# Patient Record
Sex: Male | Born: 2000 | Hispanic: No | Marital: Single | Smoking: Current every day smoker
Health system: Southern US, Community
[De-identification: ages and names within clinical notes are randomized; demographics above are authoritative.]

## PROBLEM LIST (undated history)

## (undated) DIAGNOSIS — Z789 Other specified health status: Secondary | ICD-10-CM

## (undated) DIAGNOSIS — F319 Bipolar disorder, unspecified: Secondary | ICD-10-CM

## (undated) DIAGNOSIS — T754XXA Electrocution, initial encounter: Secondary | ICD-10-CM

## (undated) DIAGNOSIS — J302 Other seasonal allergic rhinitis: Secondary | ICD-10-CM

## (undated) DIAGNOSIS — R4689 Other symptoms and signs involving appearance and behavior: Secondary | ICD-10-CM

## (undated) DIAGNOSIS — F32A Depression, unspecified: Secondary | ICD-10-CM

## (undated) DIAGNOSIS — F909 Attention-deficit hyperactivity disorder, unspecified type: Secondary | ICD-10-CM

## (undated) DIAGNOSIS — F819 Developmental disorder of scholastic skills, unspecified: Secondary | ICD-10-CM

## (undated) DIAGNOSIS — F329 Major depressive disorder, single episode, unspecified: Secondary | ICD-10-CM

---
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
## 2019-01-23 ENCOUNTER — Encounter (HOSPITAL_COMMUNITY): Payer: Self-pay | Admitting: Emergency Medicine

## 2019-01-23 ENCOUNTER — Emergency Department (HOSPITAL_COMMUNITY)
Admission: EM | Admit: 2019-01-23 | Discharge: 2019-01-23 | Disposition: A | Discharge status: Home / Self Care | Payer: Medicaid Other | Attending: Emergency Medicine | Admitting: Emergency Medicine

## 2019-01-23 ENCOUNTER — Other Ambulatory Visit: Payer: Self-pay

## 2019-01-23 DIAGNOSIS — F251 Schizoaffective disorder, depressive type: Secondary | ICD-10-CM | POA: Insufficient documentation

## 2019-01-23 DIAGNOSIS — Z79899 Other long term (current) drug therapy: Secondary | ICD-10-CM | POA: Insufficient documentation

## 2019-01-23 DIAGNOSIS — Z87891 Personal history of nicotine dependence: Secondary | ICD-10-CM | POA: Insufficient documentation

## 2019-01-23 DIAGNOSIS — J45909 Unspecified asthma, uncomplicated: Secondary | ICD-10-CM | POA: Insufficient documentation

## 2019-01-23 DIAGNOSIS — R45851 Suicidal ideations: Secondary | ICD-10-CM | POA: Diagnosis not present

## 2019-01-23 LAB — COMPREHENSIVE METABOLIC PANEL
ALT: 24 U/L (ref 0–44)
AST: 27 U/L (ref 15–41)
Albumin: 4.3 g/dL (ref 3.5–5.0)
Alkaline Phosphatase: 41 U/L (ref 38–126)
Anion gap: 12 (ref 5–15)
BUN: 15 mg/dL (ref 6–20)
CO2: 20 mmol/L — ABNORMAL LOW (ref 22–32)
Calcium: 9.4 mg/dL (ref 8.9–10.3)
Chloride: 108 mmol/L (ref 98–111)
Creatinine, Ser: 0.84 mg/dL (ref 0.61–1.24)
GFR calc Af Amer: >60 mL/min (ref >60)
GFR calc non Af Amer: >60 mL/min (ref >60)
Glucose, Bld: 98 mg/dL (ref 70–99)
Potassium: 3.8 mmol/L (ref 3.5–5.1)
Sodium: 140 mmol/L (ref 135–145)
Total Bilirubin: 1.2 mg/dL (ref 0.3–1.2)
Total Protein: 6.7 g/dL (ref 6.5–8.1)

## 2019-01-23 LAB — CBC
HCT: 46.7 % (ref 39.0–52.0)
Hemoglobin: 15.5 g/dL (ref 13.0–17.0)
MCH: 31.2 pg (ref 26.0–34.0)
MCHC: 33.2 g/dL (ref 30.0–36.0)
MCV: 94 fL (ref 80.0–100.0)
Platelets: 219 10*3/uL (ref 150–400)
RBC: 4.97 MIL/uL (ref 4.22–5.81)
RDW: 12.9 % (ref 11.5–15.5)
WBC: 8.9 10*3/uL (ref 4.0–10.5)
nRBC: 0 % (ref 0.0–0.2)

## 2019-01-23 LAB — SALICYLATE LEVEL: Salicylate Lvl: <7 mg/dL — ABNORMAL LOW (ref 7.0–30.0)

## 2019-01-23 LAB — ETHANOL: Alcohol, Ethyl (B): <10 mg/dL (ref <10)

## 2019-01-23 LAB — ACETAMINOPHEN LEVEL: Acetaminophen (Tylenol), Serum: <10 ug/mL — ABNORMAL LOW (ref 10–30)

## 2019-01-23 NOTE — ED Notes (Signed)
Patient Alert and oriented to baseline. Stable and ambulatory to baseline. Patient verbalized understanding of the discharge instructions.  Patient belongings were taken by the patient.   

## 2019-01-23 NOTE — ED Notes (Signed)
Belongings inventoried and placed into locker #5
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
## 2019-01-23 NOTE — Discharge Instructions (Signed)
Return to the emergency department if any concerning signs or symptoms develop. 

## 2019-01-23 NOTE — BHH Counselor (Signed)
  BH ASSESSMENT DISPOSITION  [facility] discussed case with BH Provider, Denzil Magnuson, NP who psych cleared pt and recommends discharge.  NP encouraged pt to continue to follow up with established community [facility] outpatient services at [facility].    Beverely Suen L. Nicky Kras, MS, [facility], [facility] Therapeutic Triage Specialist  [phone]

## 2019-01-23 NOTE — ED Notes (Signed)
Pt in shower.  

## 2019-01-23 NOTE — ED Provider Notes (Signed)
[facility] EMERGENCY DEPARTMENT Provider Note   CSN: [id number] Arrival date & time: 01/23/19  2458     History Chief Complaint  Patient presents with  . Suicidal    [name] [name] is a 19 y.o. male.   19 year old male with history of ADHD, aggressive behavior, bipolar 1 disorder presents to the emergency department complaining of suicidal ideations.  He has been seen in the ED multiple times for similar complaints.  Was last assessed by TTS 1 month ago.  Has been noncompliant with his medications for many months.  Reports thoughts to kill self by slitting wrists or hanging.  Endorses an episode of self injury a few months ago where he tried to cut his neck; however, this never required medical attention or stitches.  States that he lives with his cousin.  Has been using marijuana, but denies other illicit drug use or alcohol use.  Denies homicidal ideations.  The history is provided by the patient. No language interpreter was used.       Past Medical History:  Diagnosis Date  . ADHD (attention deficit hyperactivity disorder)   . Aggressive behavior 08/24/2018  . Asthma   . Bipolar 1 disorder ([place])   . Depression   . Learning disability   . Medical history non-contributory   . Seasonal allergies   . Shock from electroshock gun (taser) 08/24/2018   Patient was Tased by [employer] at [facility] street due to aggresive and hostile behavior.    Patient Active Problem List   Diagnosis Date Noted  . Disruptive behavior   . Schizoaffective disorder, bipolar type ([place]) 08/23/2018  . Schizoaffective disorder ([place]) 08/20/2018  . Bipolar I disorder, current or most recent episode manic, severe ([place]) 01/05/2018  . Aggressive behavior of adolescent   . DMDD (disruptive mood dysregulation disorder) ([place]) 01/12/2014  . Attention deficit hyperactivity disorder (ADHD), combined type, severe 01/12/2014  . Oppositional defiant disorder 01/12/2014  . Speech sound disorder  01/12/2014  . 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 Cannabis use disorder, mild, abuse 01/12/2014    History reviewed. No pertinent surgical history.     No family history on file.  Social History   Tobacco Use  . Smoking status: Former Smoker    Packs/day: 1.00    Types: Cigarettes  . Smokeless tobacco: Never Used  Substance Use Topics  . Alcohol use: No  . Drug use: Yes    Frequency: 3.0 times per week    Types: Marijuana    Comment: 2-3 blunts at a time    Home Medications Prior to Admission medications   Medication Sig Start Date End Date Taking? Authorizing Provider  ARIPiprazole ER (ABILIFY MAINTENA) 400 MG SRER injection Inject 2 mLs (400 mg total) into the muscle every 28 (twenty-eight) days. Due 9/30 Patient taking differently: Inject 400 mg into the muscle every 28 (twenty-eight) days.  10/14/18   Johnn Hai, MD  benztropine (COGENTIN) 0.5 MG tablet Take 1 tablet (0.5 mg total) by mouth 2 (two) times daily. 09/20/18   Johnn Hai, MD  FLUoxetine (PROZAC) 20 MG capsule Take 20 mg by mouth daily.    [provider]  gabapentin (NEURONTIN) 300 MG capsule Take 1 capsule (300 mg total) by mouth 3 (three) times daily. Patient not taking: Reported on 11/15/2018 09/20/18   Johnn Hai, MD  hydrOXYzine (ATARAX/VISTARIL) 50 MG tablet Take 50 mg by mouth 2 (two) times daily as needed for anxiety.  [provider]  lithium carbonate (ESKALITH) 450 MG CR tablet Take 1 tablet (450 mg total) by mouth 2 (two) times daily. 09/20/18   Malvin Johns, MD  risperiDONE (RISPERDAL) 2 MG tablet Take 2 mg by mouth at bedtime.    [provider]    Allergies    Haloperidol and related and Trazodone and nefazodone  Review of Systems   Review of Systems  Ten systems reviewed and are negative for acute change, except as noted in the HPI.    Physical Exam Updated Vital Signs BP 106/87 (BP Location: Right Arm)   Pulse  66   Temp 98.4 F (36.9 C) (Oral)   Resp 16   SpO2 99%   Physical Exam Vitals and nursing note reviewed.  Constitutional:      General: He is not in acute distress.    Appearance: He is well-developed. He is not diaphoretic.  HENT:     Head: Normocephalic and atraumatic.  Eyes:     General: No scleral icterus.    Conjunctiva/sclera: Conjunctivae normal.  Pulmonary:     Effort: Pulmonary effort is normal. No respiratory distress.     Comments: Respirations even and unlabored Musculoskeletal:        General: Normal range of motion.     Cervical back: Normal range of motion.  Skin:    General: Skin is warm and dry.     Coloration: Skin is not pale.     Findings: No erythema or rash.  Neurological:     Mental Status: He is alert and oriented to person, place, and time.  Psychiatric:        Behavior: Behavior normal.     Comments: SI with thoughts to cut wrists or hang self     ED Results / Procedures / Treatments   Labs (all labs ordered are listed, but only abnormal results are displayed) Labs Reviewed  CBC  COMPREHENSIVE METABOLIC PANEL  ETHANOL  SALICYLATE LEVEL  ACETAMINOPHEN LEVEL  RAPID URINE DRUG SCREEN, HOSP PERFORMED    EKG None  Radiology No results found.  Procedures Procedures (including critical care time)  Medications Ordered in ED Medications - No data to display  ED Course  I have reviewed the triage vital signs and the nursing notes.  Pertinent labs & imaging results that were available during my care of the patient were reviewed by me and considered in my medical decision making (see chart for details).   4:19 AM Covid swab held at this time as patient has historically been discharged from the ED following TTS evaluation.     MDM Rules/Calculators/A&P                       19 year old male presenting to the emergency department for complaints of suicidal thoughts.  He has a history of chronic SI for which she has been seen  multiple times in the ED.  Last assessed 1 month ago.  Patient noncompliant with his psychiatric medications.  Presently cooperative, pending TTS assessment.  Disposition to be determined by oncoming ED provider.   Final Clinical Impression(s) / ED Diagnoses Final diagnoses:  Suicidal thoughts    Rx / DC Orders ED Discharge Orders    None       Antony Madura, PA-C 01/23/19 0545    Geoffery Lyons, MD 01/23/19 [phone]

## 2019-01-23 NOTE — ED Triage Notes (Signed)
Patient reports suicidal ideation - plans to cut his wrist with auditory hallucinations onset this week .

## 2019-05-14 ENCOUNTER — Encounter (HOSPITAL_COMMUNITY): Payer: Self-pay | Admitting: *Deleted

## 2019-05-14 ENCOUNTER — Emergency Department (HOSPITAL_COMMUNITY)
Admission: EM | Admit: 2019-05-14 | Discharge: 2019-05-15 | Disposition: A | Discharge status: Other Acute Inpatient Hospital | Payer: Medicaid Other | Attending: Emergency Medicine | Admitting: Emergency Medicine

## 2019-05-14 DIAGNOSIS — F4321 Adjustment disorder with depressed mood: Secondary | ICD-10-CM | POA: Insufficient documentation

## 2019-05-14 DIAGNOSIS — X838XXA Intentional self-harm by other specified means, initial encounter: Secondary | ICD-10-CM | POA: Diagnosis not present

## 2019-05-14 DIAGNOSIS — F1290 Cannabis use, unspecified, uncomplicated: Secondary | ICD-10-CM | POA: Insufficient documentation

## 2019-05-14 DIAGNOSIS — Z20822 Contact with and (suspected) exposure to covid-19: Secondary | ICD-10-CM | POA: Insufficient documentation

## 2019-05-14 DIAGNOSIS — Z046 Encounter for general psychiatric examination, requested by authority: Secondary | ICD-10-CM | POA: Diagnosis present

## 2019-05-14 DIAGNOSIS — F319 Bipolar disorder, unspecified: Secondary | ICD-10-CM | POA: Insufficient documentation

## 2019-05-14 DIAGNOSIS — F322 Major depressive disorder, single episode, severe without psychotic features: Secondary | ICD-10-CM | POA: Diagnosis not present

## 2019-05-14 DIAGNOSIS — Z87891 Personal history of nicotine dependence: Secondary | ICD-10-CM | POA: Diagnosis not present

## 2019-05-14 LAB — COMPREHENSIVE METABOLIC PANEL
ALT: 30 U/L (ref 0–44)
AST: 28 U/L (ref 15–41)
Albumin: 4.6 g/dL (ref 3.5–5.0)
Alkaline Phosphatase: 46 U/L (ref 38–126)
Anion gap: 12 (ref 5–15)
BUN: 8 mg/dL (ref 6–20)
CO2: 26 mmol/L (ref 22–32)
Calcium: 10.1 mg/dL (ref 8.9–10.3)
Chloride: 105 mmol/L (ref 98–111)
Creatinine, Ser: 1.03 mg/dL (ref 0.61–1.24)
GFR calc Af Amer: >60 mL/min (ref >60)
GFR calc non Af Amer: >60 mL/min (ref >60)
Glucose, Bld: 99 mg/dL (ref 70–99)
Potassium: 3.3 mmol/L — ABNORMAL LOW (ref 3.5–5.1)
Sodium: 143 mmol/L (ref 135–145)
Total Bilirubin: 1.1 mg/dL (ref 0.3–1.2)
Total Protein: 8 g/dL (ref 6.5–8.1)

## 2019-05-14 LAB — CBC
HCT: 50.8 % (ref 39.0–52.0)
Hemoglobin: 16.9 g/dL (ref 13.0–17.0)
MCH: 31.5 pg (ref 26.0–34.0)
MCHC: 33.3 g/dL (ref 30.0–36.0)
MCV: 94.8 fL (ref 80.0–100.0)
Platelets: 283 10*3/uL (ref 150–400)
RBC: 5.36 MIL/uL (ref 4.22–5.81)
RDW: 12.8 % (ref 11.5–15.5)
WBC: 7.2 10*3/uL (ref 4.0–10.5)
nRBC: 0 % (ref 0.0–0.2)

## 2019-05-14 LAB — ETHANOL: Alcohol, Ethyl (B): <10 mg/dL (ref <10)

## 2019-05-14 LAB — SALICYLATE LEVEL: Salicylate Lvl: <7 mg/dL — ABNORMAL LOW (ref 7.0–30.0)

## 2019-05-14 LAB — ACETAMINOPHEN LEVEL: Acetaminophen (Tylenol), Serum: <10 ug/mL — ABNORMAL LOW (ref 10–30)

## 2019-05-14 MED ORDER — STERILE WATER FOR INJECTION IJ SOLN
INTRAMUSCULAR | Status: AC
  Filled 2019-05-14: qty 10

## 2019-05-14 MED ORDER — ZIPRASIDONE MESYLATE 20 MG IM SOLR
INTRAMUSCULAR | Status: AC
  Filled 2019-05-14: qty 20

## 2019-05-14 MED ORDER — ZOLPIDEM TARTRATE 5 MG PO TABS
5.0000 mg | ORAL_TABLET | Freq: Every evening | ORAL | Status: DC | PRN
  Administered 2019-05-14: 5 mg via ORAL
  Filled 2019-05-14: qty 1

## 2019-05-14 MED ORDER — IBUPROFEN 400 MG PO TABS
600.0000 mg | ORAL_TABLET | Freq: Three times a day (TID) | ORAL | Status: DC | PRN
  Administered 2019-05-15: 09:00:00 600 mg via ORAL
  Filled 2019-05-14: qty 1

## 2019-05-14 MED ORDER — ZIPRASIDONE MESYLATE 20 MG IM SOLR
20.0000 mg | Freq: Once | INTRAMUSCULAR | Status: AC
  Administered 2019-05-14: 20 mg via INTRAMUSCULAR

## 2019-05-14 NOTE — ED Notes (Signed)
Pt's uncle in visiting with pt at this time.  Pt calm

## 2019-05-14 NOTE — ED Notes (Addendum)
1200: Pt is agitated- pacing room and punched wall. EDP made aware. RN went into med room and sitter was in another pt's room. WHen RN came out pt was gone. Other sitter followed him and pt was escorted back to room by security and [employer]. He became very tense and was clinching fist and hitting wall creating damage. He took battery cover off of remote and attempted to cut his arm with it. We told him we were not going to let him hurt himself. He states he is "going to find a way to hurt himself no matter what, even if he has to take the officer's gun."  Security able to calm him with talking and RN called EDP to request meds. RN pulled up meds and security was able to convince him to allow RN to given it in his R deltoid. [employer] officer, Sadie, then was able to talk to him after and provide supportive listening. He discussed feeling sad, angry, and anxious. Hates that meds cause trembles. He had seen therapist in past but not working.  He then sat on bed and felt "woozy". He lied down and was covered with blanket. He was introduced to security guard who will be here this afternoon so he will have someone to talk to and not act out.

## 2019-05-14 NOTE — ED Triage Notes (Signed)
Pt arrived in gpd custody. Reported recent family loss and pt was found trying to jump off a ledge. Pt handcuffed and here for emergency commitment per officer.

## 2019-05-14 NOTE — ED Notes (Addendum)
ED Provider at bedside. PT has been wanded and staffing called for sitter (unavailable until 1500).

## 2019-05-14 NOTE — ED Provider Notes (Signed)
[facility] EMERGENCY DEPARTMENT Provider Note   CSN: [id number] Arrival date & time: 05/14/19  3818     History Chief Complaint  Patient presents with  . Suicidal    [name] [name] is a 19 y.o. male.  HPI Patient has been at the hospital for the past 24 hours visiting with his mother who was dying.  She died this morning.  He became very upset and went out of the hospital and climbed on a ledge of one of the walkway over passes.  He was spotted by a security guard and able to be brought down without injury.  Patient reports that he was just so tired he did not really know what he was doing.  He wants to leave now to go back and see if he can still visit with his mother and his other family members.  He insists that he will be okay and will not try to hurt himself again.  He reports he had history of self injury and cutting behavior but that was several years ago.  He denies he is doing any cutting anymore.  He denies any medical problems.    Past Medical History:  Diagnosis Date  . ADHD (attention deficit hyperactivity disorder)   . Aggressive behavior 08/24/2018  . Asthma   . Bipolar 1 disorder ([facility])   . Depression   . Learning disability   . Medical history non-contributory   . Seasonal allergies   . Shock from electroshock gun (taser) 08/24/2018   Patient was Tased by [employer] at [facility] across street due to aggresive and hostile behavior.    Patient Active Problem List   Diagnosis Date Noted  . Disruptive behavior   . Schizoaffective disorder, bipolar type ([facility]) 08/23/2018  . Schizoaffective disorder ([facility]) 08/20/2018  . Bipolar I disorder, current or most recent episode manic, severe ([facility]) 01/05/2018  . Aggressive behavior of adolescent   . DMDD (disruptive mood dysregulation disorder) ([facility]) 01/12/2014  . Attention deficit hyperactivity disorder (ADHD), combined type, severe 01/12/2014  . Oppositional defiant disorder 01/12/2014  . Speech sound disorder  01/12/2014  . 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 Cannabis use disorder, mild, abuse 01/12/2014    History reviewed. No pertinent surgical history.     History reviewed. No pertinent family history.  Social History   Tobacco Use  . Smoking status: Former Smoker    Packs/day: 1.00    Types: Cigarettes  . Smokeless tobacco: Never Used  Substance Use Topics  . Alcohol use: No  . Drug use: Yes    Frequency: 3.0 times per week    Types: Marijuana    Comment: 2-3 blunts at a time    Home Medications Prior to Admission medications   Medication Sig Start Date End Date Taking? Authorizing Provider  ARIPiprazole ER (ABILIFY MAINTENA) 400 MG SRER injection Inject 2 mLs (400 mg total) into the muscle every 28 (twenty-eight) days. Due 9/30 Patient not taking: Reported on 01/23/2019 10/14/18   Malvin Johns, MD  benztropine (COGENTIN) 0.5 MG tablet Take 1 tablet (0.5 mg total) by mouth 2 (two) times daily. Patient not taking: Reported on 01/23/2019 09/20/18   Malvin Johns, MD  gabapentin (NEURONTIN) 300 MG capsule Take 1 capsule (300 mg total) by mouth 3 (three) times daily. Patient not taking: Reported on 01/23/2019 09/20/18   Malvin Johns, MD  lithium carbonate (ESKALITH) 450 MG CR tablet Take 1 tablet (450 mg total) by mouth 2 (two) times daily.  Patient not taking: Reported on 01/23/2019 09/20/18   Malvin Johns, MD    Allergies    Haloperidol and related and Trazodone and nefazodone  Review of Systems   Review of Systems 10 Systems reviewed and are negative for acute change except as noted in the HPI. Physical Exam Updated Vital Signs BP 130/78   Pulse 84   Temp 98.1 F (36.7 C) (Oral)   Resp 18   SpO2 100%   Physical Exam Constitutional:      Comments: Patient is alert and nontoxic.  No respiratory distress.  As I go to his room, he has a sheet over himself and is lying on the floor.  He does sit up and talk to me.  HENT:     Head:  Normocephalic and atraumatic.     Mouth/Throat:     Mouth: Mucous membranes are moist.     Pharynx: Oropharynx is clear.  Eyes:     Extraocular Movements: Extraocular movements intact.  Cardiovascular:     Rate and Rhythm: Normal rate and regular rhythm.  Pulmonary:     Effort: Pulmonary effort is normal.     Breath sounds: Normal breath sounds.  Abdominal:     General: There is no distension.     Palpations: Abdomen is soft.     Tenderness: There is no abdominal tenderness. There is no guarding.  Musculoskeletal:     Comments: Old well-healed scars from cutting on the forearms.  No acute injuries.  No peripheral edema.  Skin:    General: Skin is warm and dry.  Neurological:     General: No focal deficit present.     Mental Status: He is oriented to person, place, and time.     Coordination: Coordination normal.  Psychiatric:     Comments: Patient's affect is flat and slightly withdrawn.     ED Results / Procedures / Treatments   Labs (all labs ordered are listed, but only abnormal results are displayed) Labs Reviewed  COMPREHENSIVE METABOLIC PANEL - Abnormal; Notable for the following components:      Result Value   Potassium 3.3 (*)    All other components within normal limits  SALICYLATE LEVEL - Abnormal; Notable for the following components:   Salicylate Lvl <7.0 (*)    All other components within normal limits  ACETAMINOPHEN LEVEL - Abnormal; Notable for the following components:   Acetaminophen (Tylenol), Serum <10 (*)    All other components within normal limits  ETHANOL  CBC  RAPID URINE DRUG SCREEN, HOSP PERFORMED    EKG None  Radiology No results found.  Procedures Procedures (including critical care time)  Medications Ordered in ED Medications - No data to display  ED Course  I have reviewed the triage vital signs and the nursing notes.  Pertinent labs & imaging results that were available during my care of the patient were reviewed by me and  considered in my medical decision making (see chart for details).    MDM Rules/Calculators/A&P                      Patient has had a recent significant stressor with the death of his mother last night.  He climbed on an overpass and was on the edge at risk for jumping.  Fortunately he was found by security and able to be brought to the ED.  No injuries sustained.  Patient is alert and appropriate.  No signs of acute illness.  Patient does  appear to be high risk for self injury.  IVC paperwork completed.  Patient is medical cleared for psych evaluation. Final Clinical Impression(s) / ED Diagnoses Final diagnoses:  Suicide attempt by inadequate means, initial encounter [facility])  Current severe episode of major depressive disorder without psychotic features without prior episode ([place])  Grief reaction    Rx / DC Orders ED Discharge Orders    None       Charlesetta Shanks, MD 05/14/19 1028

## 2019-05-14 NOTE — ED Notes (Signed)
Lunch Tray Ordered @ 1038.  
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
## 2019-05-14 NOTE — Progress Notes (Signed)
Pt meets inpatient criteria. Referral information has been sent to the following hospitals for review:  [facility] Details [facility] Details [facility] Details [facility] Details  [facility] Details [facility] Details [facility]-  Disposition will continue to assist with inpatient placement needs.   Wells Guiles, LCSW, LCAS Disposition CSW [facility]/TTS [phone]

## 2019-05-14 NOTE — ED Notes (Signed)
RN attempted to collect nasal swab and explained that it needed to be collected. Pt turned head to other side quickly but did not open eyes. RN attempted x 2 but was unsuccessful

## 2019-05-14 NOTE — Discharge Planning (Signed)
RNCM consulted regarding contacting family for pt after the passing of his mom.  RNCM reached Annett Gula, who states family is no longer in the hospital but she will reach out to him soon.  Mother given [facility] phone number.

## 2019-05-15 ENCOUNTER — Inpatient Hospital Stay (HOSPITAL_COMMUNITY)
Admission: AD | Admit: 2019-05-15 | Discharge: 2019-05-16 | DRG: 885 | Disposition: A | Discharge status: Home / Self Care | Payer: Medicaid Other | Attending: Psychiatry | Admitting: Psychiatry

## 2019-05-15 ENCOUNTER — Encounter (HOSPITAL_COMMUNITY): Payer: Self-pay | Admitting: Behavioral Health

## 2019-05-15 ENCOUNTER — Other Ambulatory Visit: Payer: Self-pay

## 2019-05-15 DIAGNOSIS — F3160 Bipolar disorder, current episode mixed, unspecified: Secondary | ICD-10-CM | POA: Diagnosis present

## 2019-05-15 DIAGNOSIS — F4310 Post-traumatic stress disorder, unspecified: Secondary | ICD-10-CM | POA: Diagnosis present

## 2019-05-15 DIAGNOSIS — F909 Attention-deficit hyperactivity disorder, unspecified type: Secondary | ICD-10-CM | POA: Diagnosis present

## 2019-05-15 DIAGNOSIS — F1229 Cannabis dependence with unspecified cannabis-induced disorder: Secondary | ICD-10-CM | POA: Diagnosis not present

## 2019-05-15 DIAGNOSIS — R45851 Suicidal ideations: Secondary | ICD-10-CM | POA: Diagnosis present

## 2019-05-15 DIAGNOSIS — Z885 Allergy status to narcotic agent status: Secondary | ICD-10-CM | POA: Diagnosis not present

## 2019-05-15 DIAGNOSIS — F1210 Cannabis abuse, uncomplicated: Secondary | ICD-10-CM | POA: Diagnosis present

## 2019-05-15 DIAGNOSIS — F319 Bipolar disorder, unspecified: Secondary | ICD-10-CM | POA: Diagnosis present

## 2019-05-15 DIAGNOSIS — F17210 Nicotine dependence, cigarettes, uncomplicated: Secondary | ICD-10-CM | POA: Diagnosis present

## 2019-05-15 LAB — LITHIUM LEVEL: Lithium Lvl: <0.06 mmol/L — ABNORMAL LOW (ref 0.60–1.20)

## 2019-05-15 LAB — RESPIRATORY PANEL BY RT PCR (FLU A&B, COVID)
Influenza A by PCR: NEGATIVE
Influenza B by PCR: NEGATIVE
SARS Coronavirus 2 by RT PCR: NEGATIVE

## 2019-05-15 MED ORDER — ARIPIPRAZOLE 5 MG PO TABS
5.0000 mg | ORAL_TABLET | Freq: Every day | ORAL | Status: DC
  Administered 2019-05-15 – 2019-05-16 (×2): 5 mg via ORAL
  Filled 2019-05-15 (×4): qty 1

## 2019-05-15 MED ORDER — ZIPRASIDONE MESYLATE 20 MG IM SOLR
20.0000 mg | Freq: Two times a day (BID) | INTRAMUSCULAR | Status: DC | PRN
  Administered 2019-05-15 – 2019-05-16 (×2): 20 mg via INTRAMUSCULAR
  Filled 2019-05-15 (×3): qty 20

## 2019-05-15 MED ORDER — LORAZEPAM 1 MG PO TABS
2.0000 mg | ORAL_TABLET | Freq: Four times a day (QID) | ORAL | Status: DC | PRN
  Administered 2019-05-15 – 2019-05-16 (×2): 2 mg via ORAL
  Filled 2019-05-15 (×3): qty 2

## 2019-05-15 MED ORDER — DIPHENHYDRAMINE HCL 50 MG/ML IJ SOLN
50.0000 mg | Freq: Four times a day (QID) | INTRAMUSCULAR | Status: DC | PRN
  Administered 2019-05-16: 50 mg via INTRAMUSCULAR
  Filled 2019-05-15 (×2): qty 1

## 2019-05-15 MED ORDER — FLUOXETINE HCL 10 MG PO CAPS
10.0000 mg | ORAL_CAPSULE | Freq: Every day | ORAL | Status: DC
  Administered 2019-05-15 – 2019-05-16 (×2): 10 mg via ORAL
  Filled 2019-05-15 (×4): qty 1

## 2019-05-15 MED ORDER — ZOLPIDEM TARTRATE 5 MG PO TABS: 5.0000 mg | ORAL_TABLET | Freq: Every evening | ORAL | Status: DC | PRN

## 2019-05-15 MED ORDER — IBUPROFEN 600 MG PO TABS
600.0000 mg | ORAL_TABLET | Freq: Three times a day (TID) | ORAL | Status: DC | PRN
  Administered 2019-05-15 – 2019-05-16 (×2): 600 mg via ORAL
  Filled 2019-05-15 (×2): qty 1

## 2019-05-15 MED ORDER — LORAZEPAM 1 MG PO TABS: 1.0000 mg | ORAL_TABLET | ORAL | Status: DC | PRN

## 2019-05-15 MED ORDER — DIPHENHYDRAMINE HCL 25 MG PO CAPS
50.0000 mg | ORAL_CAPSULE | Freq: Four times a day (QID) | ORAL | Status: DC | PRN
  Administered 2019-05-15 – 2019-05-16 (×2): 50 mg via ORAL
  Filled 2019-05-15 (×3): qty 2

## 2019-05-15 MED ORDER — LORAZEPAM 2 MG/ML IJ SOLN
2.0000 mg | Freq: Four times a day (QID) | INTRAMUSCULAR | Status: DC | PRN
  Administered 2019-05-15 – 2019-05-16 (×2): 2 mg via INTRAMUSCULAR
  Filled 2019-05-15 (×2): qty 1

## 2019-05-15 NOTE — Tx Team (Signed)
Initial Treatment Plan 05/15/2019 7:01 PM [name] [name] [id number]    PATIENT STRESSORS: Financial difficulties Marital or family conflict Traumatic event   PATIENT STRENGTHS: Average or above average intelligence Communication skills Motivation for treatment/growth Supportive family/friends   PATIENT IDENTIFIED PROBLEMS: "anxiety"  "depression"  "anger"                 DISCHARGE CRITERIA:  Ability to meet basic life and health needs Adequate post-discharge living arrangements Improved stabilization in mood, thinking, and/or behavior Medical problems require only outpatient monitoring Motivation to continue treatment in a less acute level of care Need for constant or close observation no longer present Reduction of life-threatening or endangering symptoms to within safe limits Safe-care adequate arrangements made Verbal commitment to aftercare and medication compliance Withdrawal symptoms are absent or subacute and managed without 24-hour nursing intervention  PRELIMINARY DISCHARGE PLAN: Attend aftercare/continuing care group Attend PHP/IOP Outpatient therapy Return to previous living arrangement Return to previous work or school arrangements  PATIENT/FAMILY INVOLVEMENT: This treatment plan has been presented to and reviewed with the patient, [name] [name]..  The patient and family have been given the opportunity to ask questions and make suggestions.  Quintella Reichert [place], [place] 05/15/2019, 7:01 PM

## 2019-05-15 NOTE — Progress Notes (Signed)
Psychoeducational Group Note  Date:  05/15/2019 Time:  2028  Group Topic/Focus:  Wrap-Up Group:   The focus of this group is to help patients review their daily goal of treatment and discuss progress on daily workbooks.  Participation Level: Did Not Attend  Participation Quality:  Not Applicable  Affect:  Not Applicable  Cognitive:  Not Applicable  Insight:  Not Applicable  Engagement in Group: Not Applicable  Additional Comments:  The patient did not attend group this evening.   Hazle Coca S 05/15/2019, 8:28 PM

## 2019-05-15 NOTE — Discharge Instructions (Signed)
Go now to [facility].
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
## 2019-05-15 NOTE — Progress Notes (Signed)
   05/15/19 1400  Vital Signs  Temp 97.7 F (36.5 C) (Simultaneous filing. User may not have seen previous data.)  Temp Source Oral (Simultaneous filing. User may not have seen previous data.)  Pulse Rate (!) 55 (Simultaneous filing. User may not have seen previous data.)  Pulse Rate Source Dinamap (Simultaneous filing. User may not have seen previous data.)  Resp 18 (Simultaneous filing. User may not have seen previous data.)  BP 129/83 (Simultaneous filing. User may not have seen previous data.)  BP Location Left Arm (Simultaneous filing. User may not have seen previous data.)  BP Method Automatic (Simultaneous filing. User may not have seen previous data.)  Patient Position (if appropriate) Sitting  Oxygen Therapy  SpO2 95 % (Simultaneous filing. User may not have seen previous data.)  O2 Device Room Air (Simultaneous filing. User may not have seen previous data.)  Pain Assessment  Pain Scale 0-10  Pain Score 0  Height and Weight  Height 6\' 1"  (1.854 m) (Simultaneous filing. User may not have seen previous data.)  Weight 117 kg (Simultaneous filing. User may not have seen previous data.)  Type of Scale Used Standing (Simultaneous filing. User may not have seen previous data.)  Type of Weight Actual  BSA (Calculated - sq m) 2.45 sq meters (Simultaneous filing. User may not have seen previous data.)  BMI (Calculated) 34.05 (Simultaneous filing. User may not have seen previous data.)  Weight in (lb) to have BMI = 25 189.1 (Simultaneous filing. User may not have seen previous data.)  D:  Patient was admitted around 1400.  Patient presents with a flat, guarded, depressed and anxious affect. Patient reported that his mother died yesterday and that he ran up on a bridge and tried to jump, but a security guard held him back.  Patient was flat when talking about these  A:  Support and encouragement provided Routine safety checks conducted every 15 minutes. Patient  Informed to  notify staff with any concerns. Patient contracted for safety.  Safety maintained.events R:  .  Patient cooperative, and depressed and calm. Patient interacts well with others on the unit.  Safety maintained.

## 2019-05-15 NOTE — Progress Notes (Signed)
Patient refuses all medications.  Patient stated he does not need to be here.  Patient has been walking up and down the hall.

## 2019-05-15 NOTE — ED Provider Notes (Signed)
The patient has been accepted into behavioral health.  He is currently under IVC.  They are requesting for me to discharge him to that facility.  Unfortunately due to a epic issue I am unable to discharge him with IVC paperwork in place.   Melene Plan, DO 05/15/19 1236

## 2019-05-15 NOTE — BH Assessment (Signed)
Reassessment note: Pt presents sitting on his bed. He is tearful and has difficulty saying his mother passed away yesterday. He eventually stated his mother died. Pt states he honestly wanted to die "so badly" yesterday. He states when the security man talked to him yesterday, he was helpful & made pt think, especially of what pt's mother would want.  Pt reports "I am good now" and wants to see his family. Pt does not know date for funeral. He denies current SI and when asked if he has a suicide plan he states "pretty much, no". Pt denies HI and sx of psychosis. He reports voices are all gone. Per Denzil Magnuson, NP, inpt psychiatric tx recommended.

## 2019-05-15 NOTE — ED Notes (Signed)
Lunch Tray Ordered @ 1119. 

## 2019-05-15 NOTE — ED Notes (Signed)
Breakfast ordered 

## 2019-05-15 NOTE — Progress Notes (Signed)
Pt accepted to [facility]; bed 307-2    Denzil Magnuson, NP is the accepting provider.    Dr. Jama Flavors is the attending provider.    Call report to [phone]    [facility] ED notified.     Pt is involuntary and will be transported by law enforcement   Pt is scheduled to arrive at [facility] at 1pm.   Wells Guiles, LCSW, LCAS Disposition CSW [facility] [phone]
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
## 2019-05-15 NOTE — Progress Notes (Addendum)
Patient was escorted to [address] hall.  Patient had been hitting walls in the hall and kicking/hitting door at end of hallway 300.  Dr. Jama Flavors, security guard, police officer, Kelton Pillar RN, Austin Miles RN, Drenda Freeze RN, Chandra Batch NP, witnessed patient's actions.  Patient was given ativan 1 mg IM and geodon 20 mg IM to calm him.  Patient did take abilify 5 mg p.o., prozac 10 mg p.o., and advil 600 mg p.o.  After he laid on his bed on 500 hall.  Ice packs applied to patient's R knee.  L arm redness from hitting wall and door on 300 hallway.

## 2019-05-16 LAB — LIPID PANEL
Cholesterol: 191 mg/dL (ref 0–200)
HDL: 40 mg/dL — ABNORMAL LOW (ref >40)
LDL Cholesterol: 138 mg/dL — ABNORMAL HIGH (ref 0–99)
Total CHOL/HDL Ratio: 4.8 RATIO
Triglycerides: 64 mg/dL (ref <150)
VLDL: 13 mg/dL (ref 0–40)

## 2019-05-16 LAB — TSH: TSH: 1.066 u[IU]/mL (ref 0.350–4.500)

## 2019-05-16 LAB — HEMOGLOBIN A1C
Hgb A1c MFr Bld: 4.9 % (ref 4.8–5.6)
Mean Plasma Glucose: 93.93 mg/dL

## 2019-05-16 MED ORDER — FLUOXETINE HCL 10 MG PO CAPS: 10.0000 mg | ORAL_CAPSULE | Freq: Every day | ORAL | 0 refills | Status: DC

## 2019-05-16 MED ORDER — ARIPIPRAZOLE 10 MG PO TABS: 10.0000 mg | ORAL_TABLET | Freq: Every day | ORAL | 1 refills | Status: DC

## 2019-05-16 NOTE — Progress Notes (Signed)
Nutrition Brief Note RD working remotely.   Patient identified on the Malnutrition Screening Tool (MST) Report  Wt Readings from Last 15 Encounters:  05/15/19 117 kg (>99 %, Z= 2.52)*  12/15/18 109.8 kg (99 %, Z= 2.29)*  11/29/18 114 kg (>99 %, Z= 2.44)*  11/15/18 114 kg (>99 %, Z= 2.44)*  11/06/18 114 kg (>99 %, Z= 2.44)*  09/13/18 114.3 kg (>99 %, Z= 2.45)*  08/24/18 (S) 100 kg (97 %, Z= 1.91)*  08/23/18 105 kg (98 %, Z= 2.12)*  08/20/18 118.8 kg (>99 %, Z= 2.59)*  08/18/18 105 kg (98 %, Z= 2.12)*  02/06/18 101.6 kg (98 %, Z= 2.01)*  02/06/18 101.6 kg (98 %, Z= 2.01)*  01/08/18 101.6 kg (98 %, Z= 2.02)*  04/26/17 117.9 kg (>99 %, Z= 2.63)*  10/16/14 (!) 139.8 kg (>99 %, Z= 3.70)*   * Growth percentiles are based on CDC (Boys, 2-20 Years) data.    Body mass index is 34.04 kg/m. Patient meets criteria for obesity based on current BMI. Current weight is the highest weight since 04/26/17.   Patient was admitted 4/29 for SI, hx of bipolar disorder, and cannabis use.   Current diet order is Regular with 2L fluid restriction and patient is eating as he desires for meals and snacks at this time. Labs and medications reviewed.   No nutrition interventions warranted at this time. If nutrition issues arise, please consult RD.      Trenton Gammon, MS, RD, LDN, CNSC Inpatient Clinical Dietitian RD pager # available in [facility]  After hours/weekend pager # available in [facility]

## 2019-05-16 NOTE — Tx Team (Signed)
Interdisciplinary Treatment and Diagnostic Plan Update  05/16/2019 Time of Session: 0908 [name] [name] MRN: [id number]  Principal Diagnosis: <principal problem not specified>  Secondary Diagnoses: Active Problems:   Bipolar 1 disorder (HCC)   Current Medications:  Current Facility-Administered Medications  Medication Dose Route Frequency Provider Last Rate Last Admin  . ARIPiprazole (ABILIFY) tablet 5 mg  5 mg Oral Daily Cobos, Myer Peer, MD   5 mg at 05/16/19 0736  . diphenhydrAMINE (BENADRYL) capsule 50 mg  50 mg Oral Q6H PRN Money, Darnelle Maffucci B, FNP   50 mg at 05/16/19 0347   Or  . diphenhydrAMINE (BENADRYL) injection 50 mg  50 mg Intramuscular Q6H PRN Money, Darnelle Maffucci B, FNP   50 mg at 05/16/19 1014  . FLUoxetine (PROZAC) capsule 10 mg  10 mg Oral Daily Cobos, Myer Peer, MD   10 mg at 05/16/19 0736  . ibuprofen (ADVIL) tablet 600 mg  600 mg Oral Q8H PRN Mordecai Maes, NP   600 mg at 05/16/19 0553  . LORazepam (ATIVAN) tablet 2 mg  2 mg Oral Q6H PRN Money, Lowry Ram, FNP   2 mg at 05/16/19 0348   Or  . LORazepam (ATIVAN) injection 2 mg  2 mg Intramuscular Q6H PRN Money, Lowry Ram, FNP   2 mg at 05/16/19 1014  . ziprasidone (GEODON) injection 20 mg  20 mg Intramuscular Q12H PRN Mordecai Maes, NP   20 mg at 05/16/19 1014   PTA Medications: Medications Prior to Admission  Medication Sig Dispense Refill Last Dose  . ARIPiprazole ER (ABILIFY MAINTENA) 400 MG SRER injection Inject 2 mLs (400 mg total) into the muscle every 28 (twenty-eight) days. Due 9/30 (Patient not taking: Reported on 05/14/2019) 1 each 11   . benztropine (COGENTIN) 0.5 MG tablet Take 1 tablet (0.5 mg total) by mouth 2 (two) times daily. (Patient not taking: Reported on 05/14/2019) 60 tablet 2   . gabapentin (NEURONTIN) 300 MG capsule Take 1 capsule (300 mg total) by mouth 3 (three) times daily. (Patient not taking: Reported on 05/14/2019) 90 capsule 2   . lithium carbonate (ESKALITH) 450 MG CR tablet Take 1 tablet (450  mg total) by mouth 2 (two) times daily. 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   Physician Treatment Plan for Primary Diagnosis: <principal problem not specified> Long Term Goal(s): Improvement in symptoms so as ready for discharge Improvement in symptoms so as ready for discharge   Short Term Goals: Ability to identify changes in lifestyle to reduce recurrence of condition will improve Ability to verbalize feelings will improve Ability to disclose and discuss suicidal ideas Ability to demonstrate self-control will improve Ability to identify and develop effective coping behaviors will improve Ability to maintain clinical measurements within normal limits will improve Compliance with prescribed medications will improve Ability to identify changes in lifestyle to reduce recurrence of condition will improve Ability to verbalize feelings will improve Ability to disclose and discuss suicidal ideas Ability to demonstrate self-control will improve Ability to identify and develop effective coping behaviors will improve Ability to maintain clinical measurements within normal limits will improve  Medication Management: Evaluate patient's response, side effects, and tolerance of medication regimen.  Therapeutic Interventions: 1 to 1 sessions, Unit  Group sessions and Medication administration.  Evaluation of Outcomes: Not Met  Physician Treatment Plan for Secondary Diagnosis: Active Problems:   Bipolar 1 disorder ([place])  Long Term Goal(s): Improvement in symptoms so as ready for discharge Improvement  in symptoms so as ready for discharge   Short Term Goals: Ability to identify changes in lifestyle to reduce recurrence of condition will improve Ability to verbalize feelings will improve Ability to disclose and discuss suicidal ideas Ability to demonstrate self-control will improve Ability to identify and develop effective coping behaviors will improve Ability to maintain clinical measurements within normal limits will improve Compliance with prescribed medications will improve Ability to identify changes in lifestyle to reduce recurrence of condition will improve Ability to verbalize feelings will improve Ability to disclose and discuss suicidal ideas Ability to demonstrate self-control will improve Ability to identify and develop effective coping behaviors will improve Ability to maintain clinical measurements within normal limits will improve     Medication Management: Evaluate patient's response, side effects, and tolerance of medication regimen.  Therapeutic Interventions: 1 to 1 sessions, Unit Group sessions and Medication administration.  Evaluation of Outcomes: Not Met   RN Treatment Plan for Primary Diagnosis: <principal problem not specified> Long Term Goal(s): Knowledge of disease and therapeutic regimen to maintain health will improve  Short Term Goals: Ability to identify and develop effective coping behaviors will improve and Compliance with prescribed medications will improve  Medication Management: RN will administer medications as ordered by provider, will assess and evaluate patient's response and provide education to patient for prescribed medication. RN will report any adverse and/or side effects to prescribing provider.  Therapeutic Interventions: 1 on 1 counseling sessions, Psychoeducation, Medication administration, Evaluate responses to treatment, Monitor vital signs and CBGs as ordered, Perform/monitor CIWA, COWS, AIMS and Fall Risk screenings as ordered,  Perform wound care treatments as ordered.  Evaluation of Outcomes: Not Met   LCSW Treatment Plan for Primary Diagnosis: <principal problem not specified> Long Term Goal(s): Safe transition to appropriate next level of care at discharge, Engage patient in therapeutic group addressing interpersonal concerns.  Short Term Goals: Engage patient in aftercare planning with referrals and resources, Increase social support and Increase skills for wellness and recovery  Therapeutic Interventions: Assess for all discharge needs, 1 to 1 time with Social worker, Explore available resources and support systems, Assess for adequacy in community support network, Educate family and significant other(s) on suicide prevention, Complete Psychosocial Assessment, Interpersonal group therapy.  Evaluation of Outcomes: Not Met   Progress in Treatment: Attending groups: No. Participating in groups: No. Taking medication as prescribed: Yes. Toleration medication: Yes. Family/Significant other contact made: No, will contact:  when given permission Patient understands diagnosis: No. Discussing patient identified problems/goals with staff: No. Medical problems stabilized or resolved: Yes. Denies suicidal/homicidal ideation: Yes. Issues/concerns per patient self-inventory: No. Other: none  New problem(s) identified: No, Describe:  none  New Short Term/Long Term Goal(s):  Patient Goals:  "discharge"  Discharge Plan or Barriers:   Reason for Continuation of Hospitalization: Depression Medication stabilization  Estimated Length of Stay: 3-5 days  Attendees: Patient:[name] [name] 05/16/2019   Physician:  05/16/2019   Nursing: Neldon Newport, RN 05/16/2019   RN Care Manager: 05/16/2019   Social Worker: Lurline Idol, LCSW 05/16/2019   Recreational Therapist:  05/16/2019   Other: Darnelle Maffucci Money, RNP 05/16/2019   Other:  05/16/2019   Other: 05/16/2019        Scribe for Treatment Team: Joanne Chars,  LCSW 05/16/2019 10:46  AM

## 2019-05-16 NOTE — Progress Notes (Signed)
Pt discharged to lobby. Pt was stable and appreciative at that time. All papers and prescriptions were given and valuables returned. Verbal understanding expressed. Denies SI/HI and A/VH. Pt given opportunity to express concerns and ask questions.  

## 2019-05-16 NOTE — [facility] Group Notes (Signed)
LCSW Aftercare Discharge Planning Group Note   05/16/2019 1100am  Type of Group and Topic: Psychoeducational Group:  Discharge Planning  Participation Level:  Minimal  Description of Group  Discharge planning group reviews patient's anticipated discharge plans and assists patients to anticipate and address any barriers to wellness/recovery in the community.  Suicide prevention education is reviewed with patients in group.  Therapeutic Goals 1. Patients will state their anticipated discharge plan and mental health aftercare 2. Patients will identify potential barriers to wellness in the community setting 3. Patients will engage in problem solving, solution focused discussion of ways to anticipate and address barriers to wellness/recovery  Summary of Patient Progress: Pt came and left from the room several times. Pt not engaged but did respond when asked a direct question by CSW.   Plan for Discharge/Comments:    Transportation Means:   Supports:  Therapeutic Modalities: Motivational Interviewing    Lorri Frederick, LCSW 05/16/2019 2:24 PM

## 2019-05-16 NOTE — Progress Notes (Signed)
Recreation Therapy Notes  Patient admitted to unit 4.29.21. Due to admission within last year, no new assessment conducted at this time. Last assessment conducted 8.31.20. Patient reports mother's death a week ago as current stressor.  Patient reports suicide attempt as reason for admission.  Patient expresses coping skills are self-injury, sports, tv, arguments, music, exercise, meditate, substance abuse, impulsivity, avoidance and reading.  Patient identifies leisure interests as football, mixed martial arts and video games which is done daily.  Patient strengths are football and leg strength.  Areas of improvement are none.    Patient denies SI, HI, AVH at this time. Patient reports goal of "stop injuring self".    Caroll Rancher, LRT/CTRS   Caroll Rancher A 05/16/2019 12:28 PM

## 2019-05-16 NOTE — [facility] Suicide Risk Assessment (Signed)
[facility] INPATIENT:  Family/Significant Other Suicide Prevention Education  Suicide Prevention Education:  Contact Attempts: Brother, Rollene Rotunda, [phone], has been identified by the patient as the family member/significant other with whom the patient will be residing, and identified as the person(s) who will aid the patient in the event of a mental health crisis.  With written consent from the patient, two attempts were made to provide suicide prevention education, prior to and/or following the patient's discharge.  We were unsuccessful in providing suicide prevention education.  A suicide education pamphlet was given to the patient to share with family/significant other.  Date and time of first attempt:05/16/19, 1320 Date and time of second attempt:  Lorri Frederick, LCSW 05/16/2019, 1:19 PM
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
## 2019-05-16 NOTE — Discharge Summary (Signed)
Physician Discharge Summary Note  Patient:  [name] [name] is an 19 y.o., male MRN:  [id number] DOB:  Mar 20, 2000 Patient phone:  [phone] (home)  Patient address:   [place] [address],  Total Time spent with patient: 30 minutes  Date of Admission:  05/15/2019 Date of Discharge: 05/16/19  Reason for Admission:  19 year old male. Reports that his mother recently passed away earlier this week - two days ago. States that he was sleeping in the hospital day room when a family member informed him of her passing . He then went out of the hospital and climbed on a walkway ledge with thoughts of jumping. States that while there he thought " this is not what my mother would want me to do" and that a security guard pulled him back. He also reports recent episode of self cutting and has superficial scratches/cuts on L forearm.  Principal Problem: Bipolar 1 disorder [facility]) Discharge Diagnoses: Principal Problem:   Bipolar 1 disorder [facility])   Past Psychiatric History:  history of prior psychiatric admissions- most recently 08/2018. At the time reported depression. He also endorses past history of psychotic symptoms, but not recently.  He has been diagnosed with Bipolar Disorder/ PTSD in the past  in the past.  He also reports history of cannabis use disorder. As per 8/28 Admission Note by Dr. Jake Samples, Borderline Personality Disorder has also been considered as diagnosis.  Reports history of past suicide attempts and of a suicide attempt by trying to cut throat when aged 73.   Past Medical History:  Past Medical History:  Diagnosis Date  . ADHD (attention deficit hyperactivity disorder)   . Aggressive behavior 08/24/2018  . Asthma   . Bipolar 1 disorder ([place])   . Depression   . Learning disability   . Medical history non-contributory   . Seasonal allergies   . Shock from electroshock gun (taser) 08/24/2018   Patient was Tased by [employer] at [facility] across street due  to aggresive and hostile behavior.   History reviewed. No pertinent surgical history. Family History: History reviewed. No pertinent family history. Family Psychiatric  History: denies history of mental illness or of suicides in family. Father has history of alcohol use disorder Social History:  Social History   Substance and Sexual Activity  Alcohol Use No     Social History   Substance and Sexual Activity  Drug Use Yes  . Frequency: 3.0 times per week  . Types: Marijuana   Comment: 2-3 blunts at a time    Social History   Socioeconomic History  . Marital status: Single    Spouse name: Not on file  . Number of children: Not on file  . Years of education: Not on file  . Highest education level: Not on file  Occupational History  . Occupation: Unemployed  Tobacco Use  . Smoking status: Former Smoker    Packs/day: 1.00    Types: Cigarettes  . Smokeless tobacco: Never Used  Substance and Sexual Activity  . Alcohol use: No  . Drug use: Yes    Frequency: 3.0 times per week    Types: Marijuana    Comment: 2-3 blunts at a time  . Sexual activity: Never  Other Topics Concern  . Not on file  Social History Narrative   Pt stated that he lives with his godfather.  He is unemployed, and he is followed by [employer].   Social Determinants of Radio broadcast assistant  Strain:   . Difficulty of Paying Living Expenses:   Food Insecurity:   . Worried About Programme researcher, broadcasting/film/video in the Last Year:   . Barista in the Last Year:   Transportation Needs:   . Freight forwarder (Medical):   Marland Kitchen Lack of Transportation (Non-Medical):   Physical Activity:   . Days of Exercise per Week:   . Minutes of Exercise per Session:   Stress:   . Feeling of Stress :   Social Connections:   . Frequency of Communication with Friends and Family:   . Frequency of Social Gatherings with Friends and Family:   . Attends Religious Services:   . Active Member of Clubs or Organizations:   .  Attends Banker Meetings:   Marland Kitchen Marital Status:     Hospital Course:  Patient remained on the [facility] unit for 1 days. The patient stabilized on medication and therapy. Patient was discharged on Abilify 10 mg Daily and Prozac 10 mg Daily. Patient was also instructed to continue his home medications. Patient has shown improvement with improved mood, affect, sleep, appetite, and interaction. Patient has attended group and participated. Patient has been seen in the day room interacting with peers and staff appropriately. Patient denies any SI/HI/AVH and contracts for safety. Patient agrees to follow up at [facility]. Patient is provided with prescriptions for their medications upon discharge. Patient was irritable until a discussion with his family and him about discharge took place. The patient was irritable due to wanting to be at home with his family since his mother died 3 days ago.   Physical Findings: AIMS:  , ,  ,  ,    CIWA:    COWS:     Musculoskeletal: Strength & Muscle Tone: within normal limits Gait & Station: normal Patient leans: N/A  Psychiatric Specialty Exam: Physical Exam  Nursing note and vitals reviewed. Constitutional: He is oriented to person, place, and time. He appears well-developed and well-nourished.  Cardiovascular: Normal rate.  Respiratory: Effort normal.  Musculoskeletal:        General: Normal range of motion.  Neurological: He is alert and oriented to person, place, and time.  Skin: Skin is warm.    Review of Systems  Constitutional: Negative.   HENT: Negative.   Eyes: Negative.   Respiratory: Negative.   Cardiovascular: Negative.   Gastrointestinal: Negative.   Genitourinary: Negative.   Musculoskeletal: Negative.   Skin: Negative.   Neurological: Negative.   Psychiatric/Behavioral: Negative.     Blood pressure (!) 118/102, pulse (!) 115, temperature 97.7 F (36.5 C), temperature source Oral, resp. rate 18, height 6\' 1"  (1.854 m), weight 117  kg, SpO2 95 %.Body mass index is 34.04 kg/m.   General Appearance: Casual  Eye Contact::  Fair  Speech:  Clear and Coherent and Normal Rate409  Volume:  Normal  Mood:  Dysphoric  Affect:  Appropriate and angry  Thought Process:  Coherent, Goal Directed and Descriptions of Associations: Intact  Orientation:  Full (Time, Place, and Person)  Thought Content:  WDL  Suicidal Thoughts:  No  Homicidal Thoughts:  No  Memory:  Immediate;   Fair Recent;   Fair Remote;   Fair  Judgement:  Intact  Insight:  Present  Psychomotor Activity:  Normal  Concentration:  Fair  Recall:  [id number] of Knowledge:Fair  Language: Fair  Akathisia:  No  Handed:  Right  AIMS (if indicated):     Assets:  Desire for  Improvement Housing Physical Health Social Support Transportation  Sleep:  Number of Hours: 7.25  Cognition: WNL  ADL's:  Intact      Has this patient used any form of tobacco in the last 30 days? (Cigarettes, Smokeless Tobacco, Cigars, and/or Pipes) Yes, No  Blood Alcohol level:  Lab Results  Component Value Date   ETH <10 05/14/2019   ETH <10 01/23/2019    Metabolic Disorder Labs:  Lab Results  Component Value Date   HGBA1C 4.9 05/16/2019   MPG 93.93 05/16/2019   MPG 88.19 09/14/2018   No results found for: PROLACTIN Lab Results  Component Value Date   CHOL 191 05/16/2019   TRIG 64 05/16/2019   HDL 40 (L) 05/16/2019   CHOLHDL 4.8 05/16/2019   VLDL 13 05/16/2019   LDLCALC 138 (H) 05/16/2019   LDLCALC 143 (H) 09/14/2018    See Psychiatric Specialty Exam and Suicide Risk Assessment completed by Attending Physician prior to discharge.  Discharge destination:  Home  Is patient on multiple antipsychotic therapies at discharge:  No   Has Patient had three or more failed trials of antipsychotic monotherapy by history:  No  Recommended Plan for Multiple Antipsychotic Therapies: NA   Allergies as of 05/16/2019      Reactions   Haloperidol And Related Anaphylaxis,  Swelling, Other (See Comments)   Tongue numb and swollen - possible reaction to haloperidol and/or trazodone 01/08/18   Trazodone And Nefazodone Anaphylaxis, Swelling, Other (See Comments)   Tongue numb and swollen - possible reaction to haloperidol and/or trazodone 01/08/18      Medication List    TAKE these medications     Indication  ARIPiprazole ER 400 MG Srer injection Commonly known as: ABILIFY MAINTENA Inject 2 mLs (400 mg total) into the muscle every 28 (twenty-eight) days. Due 9/30 What changed: Another medication with the same name was added. Make sure you understand how and when to take each.  Indication: MIXED BIPOLAR AFFECTIVE DISORDER   ARIPiprazole 10 MG tablet Commonly known as: ABILIFY Take 1 tablet (10 mg total) by mouth daily. Start taking on: May 17, 2019 What changed: You were already taking a medication with the same name, and this prescription was added. Make sure you understand how and when to take each.  Indication: Mood stability   benztropine 0.5 MG tablet Commonly known as: COGENTIN Take 1 tablet (0.5 mg total) by mouth 2 (two) times daily.  Indication: Extrapyramidal Reaction caused by Medications   FLUoxetine 10 MG capsule Commonly known as: PROZAC Take 1 capsule (10 mg total) by mouth daily. Start taking on: May 17, 2019  Indication: Depression   gabapentin 300 MG capsule Commonly known as: NEURONTIN Take 1 capsule (300 mg total) by mouth 3 (three) times daily.  Indication: Restless Leg Syndrome   lithium carbonate 450 MG CR tablet Commonly known as: ESKALITH Take 1 tablet (450 mg total) by mouth 2 (two) times daily.  Indication: Manic-Depression      Follow-up Information    [name] on 05/21/2019.   Why: You are scheduled for an appointment on 05/21/19 at 10:30 am.  This appointment will be held in person. Contact information: [address] [place] [address] [phone]           Follow-up recommendations:  Continue  activity as tolerated. Continue diet as recommended by your PCP. Ensure to keep all appointments with outpatient providers.  Comments:  Patient is instructed prior to discharge to: Take all medications as prescribed by  his/her mental healthcare provider. Report any adverse effects and or reactions from the medicines to his/her outpatient provider promptly. Patient has been instructed & cautioned: To not engage in alcohol and or illegal drug use while on prescription medicines. In the event of worsening symptoms, patient is instructed to call the crisis hotline, 911 and or go to the nearest ED for appropriate evaluation and treatment of symptoms. To follow-up with his/her primary care provider for your other medical issues, concerns and or health care needs.    Signed: Gerlene Burdock Ameli Sangiovanni, FNP 05/16/2019, 3:24 PM

## 2019-05-16 NOTE — Plan of Care (Signed)
Pt is being discharged.    Roshanda Balazs, LRT/CTRS 

## 2019-05-16 NOTE — Progress Notes (Signed)
Progress note  Pt asked to go to the cafeteria but pt had a tray brought back for them. pt was given other options but pt declined. pt then called their aunt and became agitated because the pt states the aunt said staff at [facility] told them the pt couldn't leave. pt then began to hit the wall and smashed a bottle of water on the wall. pt also tried to break into their own room by shouldering the door. pt then came out and was belligerent with staff, refusing to communicated because they said all staff were "liars". Pt grabbed the laundry bin and attempted to throw this against the exit door but failed. pt was asked to go to their room because of safety concerns. pt then spoke to the nurse practitioner and the NP assured the pt the team was working on trying to discharge the pt to a safe situation. pt then seemed to de-escalate and asked for a meal tray. pt is calmer now and asking to go to rec time.

## 2019-05-16 NOTE — [facility] Suicide Risk Assessment (Signed)
[facility] Discharge Suicide Risk Assessment   Principal Problem: Bipolar 1 disorder [facility]) Discharge Diagnoses: Principal Problem:   Bipolar 1 disorder (H[facility]) Patient is a 18 year who lost his mom recently. Patient struggling with grief, contracts for safety, adds the misses mom and wants to attend her funeral and be with his family  Total Time spent with patient: 20 minutes  Musculoskeletal: Strength & Muscle Tone: within normal limits Gait & Station: normal Patient leans: N/A  Psychiatric Specialty Exam: Review of Systems  Blood pressure (!) 118/102, pulse (!) 115, temperature 97.7 F (36.5 C), temperature source Oral, resp. rate 18, height 6\' 1"  (1.854 m), weight 117 kg, SpO2 95 %.Body mass index is 34.04 kg/m.  General Appearance: Casual  Eye Contact::  Fair  Speech:  Clear and Coherent and Normal Rate409  Volume:  Normal  Mood:  Dysphoric  Affect:  Appropriate and angry  Thought Process:  Coherent, Goal Directed and Descriptions of Associations: Intact  Orientation:  Full (Time, Place, and Person)  Thought Content:  WDL  Suicidal Thoughts:  No  Homicidal Thoughts:  No  Memory:  Immediate;   Fair Recent;   Fair Remote;   Fair  Judgement:  Intact  Insight:  Present  Psychomotor Activity:  Normal  Concentration:  Fair  Recall:  [id number] of Knowledge:Fair  Language: Fair  Akathisia:  No  Handed:  Right  AIMS (if indicated):     Assets:  Desire for Improvement Housing Physical Health Social Support Transportation  Sleep:  Number of Hours: 7.25  Cognition: WNL  ADL's:  Intact   Mental Status Per Nursing Assessment::   On Admission:  Suicidal ideation indicated by patient, Self-harm thoughts  Demographic Factors:  Male and recent death of mother  Loss Factors: death of mom  Historical Factors: Impulsivity  Risk Reduction Factors:   Sense of responsibility to family and Living with another person, especially a relative  Continued Clinical Symptoms:   Previous Psychiatric Diagnoses and Treatments  Cognitive Features That Contribute To Risk:  None    Suicide Risk:  Mild:  Suicidal ideation of limited frequency, intensity, duration, and specificity.  There are no identifiable plans, no associated intent, mild dysphoria and related symptoms, good self-control (both objective and subjective assessment), few other risk factors, and identifiable protective factors, including available and accessible social support.  Follow-up Information    [name].   Why: Please call this provider to obtain your upcoming appointment date and time.  This provider also has walk in hours from 8:30 am to 5:00 pm, Monday through Friday. Contact information: [address] [place] [phone]           Plan Of Care/Follow-up recommendations:  Activity:  as tolerated Diet:  regular Other:  keep follow up appointment and take medication as prescribed  [phone], MD 05/16/2019, 1:45 PM

## 2019-05-16 NOTE — [facility] Suicide Risk Assessment (Signed)
[facility] INPATIENT:  Family/Significant Other Suicide Prevention Education  Suicide Prevention Education:  Education Completed; Sheran Lawless, aunt, [phone], has been identified by the patient as the family member/significant other with whom the patient will be residing, and identified as the person(s) who will aid the patient in the event of a mental health crisis (suicidal ideations/suicide attempt).  With written consent from the patient, the family member/significant other has been provided the following suicide prevention education, prior to the and/or following the discharge of the patient.  The suicide prevention education provided includes the following:  Suicide risk factors  Suicide prevention and interventions  National Suicide Hotline telephone number  [facility] assessment telephone number  [facility] Emergency Assistance 911  [facility] and/or Residential Mobile Crisis Unit telephone number  Request made of family/significant other to:  Remove weapons (e.g., guns, rifles, knives), all items previously/currently identified as safety concern.  Lawson Fiscal not aware that pt has any guns.  Remove drugs/medications (over-the-counter, prescriptions, illicit drugs), all items previously/currently identified as a safety concern.  The family member/significant other verbalizes understanding of the suicide prevention education information provided.  The family member/significant other agrees to remove the items of safety concern listed above.  Aunt reports that pt stays with his brother but neither of them have any money, neither work.  She will willing to try to help but also works and is not sure how this will all work yet with pt mother gone.  Pt told her he was not really going to harm himself when he was on the ledge, he "just needed to do something drastic to get admitted."  Lorri Frederick, LCSW 05/16/2019, 1:23 PM

## 2019-05-16 NOTE — Progress Notes (Signed)
Recreation Therapy Notes  Date: 4.30.21 Time: 1000 Location: 500 Hall Dayroom  Group Topic: Communication, Team Building, Problem Solving  Goal Area(s) Addresses:  Patient will effectively work with peer towards shared goal.  Patient will identify skill used to make activity successful.  Patient will identify how skills used during activity can be used to reach post d/c goals.   Intervention: STEM Activity   Activity: In team's, using 10 red plastic cups, patients were asked to flip the cups over and build a pyramid using the rubber band contraption provided to them.    Education: Social Skills, Discharge Planning.   Education Outcome: Acknowledges education/In group clarification offered/Needs additional education.   Clinical Observations/Feedback: Pt did not attend group session.      Jaqualyn Juday, LRT/CTRS         Breionna Punt A 05/16/2019 12:09 PM 

## 2019-05-16 NOTE — Progress Notes (Signed)
Recreation Therapy Notes  INPATIENT RECREATION TR PLAN  Patient Details Name: Derrik Mceachern MRN: [id number] DOB: [birth year] Today's Date: 05/16/2019  Rec Therapy Plan Is patient appropriate for Therapeutic Recreation?: Yes Treatment times per week: about 3 days Estimated Length of Stay: 5-7 days TR Treatment/Interventions: Group participation (Comment)  Discharge Criteria Pt will be discharged from therapy if:: Discharged Treatment plan/goals/alternatives discussed and agreed upon by:: Patient/family  Discharge Summary Short term goals set: See patient care plan Short term goals met: Not met Progress toward goals comments: (None) Reason goals not met: Pt is being discharged Therapeutic equipment acquired: None Reason patient discharged from therapy: Discharge from hospital Pt/family agrees with progress & goals achieved: Yes Date patient discharged from therapy: 05/16/19    Victorino Sparrow, LRT/CTRS  Ria Comment, Anhthu Perdew A 05/16/2019, 1:21 PM

## 2019-05-16 NOTE — Progress Notes (Signed)
  [facility] Adult Case Management Discharge Plan :  Will you be returning to the same living situation after discharge:  Yes,  with brother At discharge, do you have transportation home?: Yes,  aunt Do you have the ability to pay for your medications: Yes,  medicaid  Release of information consent forms completed and in the chart;  Patient's signature needed at discharge.  Patient to Follow up at: Follow-up Information    [name].   Why: Please call this provider to obtain your upcoming appointment date and time.  This provider also has walk in hours from 8:30 am to 5:00 pm, Monday through Friday. Contact information: [address] [place] [address] [phone]           Next level of care provider has access to [facility] Link:no  Safety Planning and Suicide Prevention discussed: Yes,  with aunt     Has patient been referred to the Quitline?: Patient refused referral  Patient has been referred for addiction treatment: Yes, [facility]  Lorri Frederick, LCSW 05/16/2019, 1:45 PM

## 2019-05-17 LAB — PROLACTIN: Prolactin: 11.1 ng/mL (ref 4.0–15.2)

## 2019-08-05 ENCOUNTER — Encounter (HOSPITAL_COMMUNITY): Payer: Self-pay | Admitting: Emergency Medicine

## 2019-08-05 ENCOUNTER — Emergency Department (HOSPITAL_COMMUNITY)
Admission: EM | Admit: 2019-08-05 | Discharge: 2019-08-10 | Disposition: A | Discharge status: Still In Hospital | Payer: Medicaid Other | Attending: Emergency Medicine | Admitting: Emergency Medicine

## 2019-08-05 ENCOUNTER — Other Ambulatory Visit: Payer: Self-pay

## 2019-08-05 DIAGNOSIS — Z87891 Personal history of nicotine dependence: Secondary | ICD-10-CM | POA: Insufficient documentation

## 2019-08-05 DIAGNOSIS — F79 Unspecified intellectual disabilities: Secondary | ICD-10-CM

## 2019-08-05 DIAGNOSIS — R45851 Suicidal ideations: Secondary | ICD-10-CM | POA: Diagnosis not present

## 2019-08-05 DIAGNOSIS — Z20822 Contact with and (suspected) exposure to covid-19: Secondary | ICD-10-CM | POA: Insufficient documentation

## 2019-08-05 DIAGNOSIS — R456 Violent behavior: Secondary | ICD-10-CM | POA: Insufficient documentation

## 2019-08-05 DIAGNOSIS — R4589 Other symptoms and signs involving emotional state: Secondary | ICD-10-CM | POA: Diagnosis present

## 2019-08-05 DIAGNOSIS — F902 Attention-deficit hyperactivity disorder, combined type: Secondary | ICD-10-CM | POA: Diagnosis present

## 2019-08-05 DIAGNOSIS — F919 Conduct disorder, unspecified: Secondary | ICD-10-CM | POA: Diagnosis present

## 2019-08-05 LAB — COMPREHENSIVE METABOLIC PANEL
ALT: 54 U/L — ABNORMAL HIGH (ref 0–44)
AST: 33 U/L (ref 15–41)
Albumin: 4.5 g/dL (ref 3.5–5.0)
Alkaline Phosphatase: 40 U/L (ref 38–126)
Anion gap: 9 (ref 5–15)
BUN: 10 mg/dL (ref 6–20)
CO2: 26 mmol/L (ref 22–32)
Calcium: 9.6 mg/dL (ref 8.9–10.3)
Chloride: 104 mmol/L (ref 98–111)
Creatinine, Ser: 0.89 mg/dL (ref 0.61–1.24)
GFR calc Af Amer: >60 mL/min (ref >60)
GFR calc non Af Amer: >60 mL/min (ref >60)
Glucose, Bld: 102 mg/dL — ABNORMAL HIGH (ref 70–99)
Potassium: 3.9 mmol/L (ref 3.5–5.1)
Sodium: 139 mmol/L (ref 135–145)
Total Bilirubin: 0.8 mg/dL (ref 0.3–1.2)
Total Protein: 7.5 g/dL (ref 6.5–8.1)

## 2019-08-05 LAB — RAPID URINE DRUG SCREEN, HOSP PERFORMED
Amphetamines: NOT DETECTED
Barbiturates: NOT DETECTED
Benzodiazepines: NOT DETECTED
Cocaine: NOT DETECTED
Opiates: NOT DETECTED
Tetrahydrocannabinol: POSITIVE — AB

## 2019-08-05 LAB — CBC
HCT: 51.5 % (ref 39.0–52.0)
Hemoglobin: 16.8 g/dL (ref 13.0–17.0)
MCH: 30.8 pg (ref 26.0–34.0)
MCHC: 32.6 g/dL (ref 30.0–36.0)
MCV: 94.3 fL (ref 80.0–100.0)
Platelets: 258 10*3/uL (ref 150–400)
RBC: 5.46 MIL/uL (ref 4.22–5.81)
RDW: 13 % (ref 11.5–15.5)
WBC: 6.6 10*3/uL (ref 4.0–10.5)
nRBC: 0 % (ref 0.0–0.2)

## 2019-08-05 LAB — SARS CORONAVIRUS 2 BY RT PCR (HOSPITAL ORDER, PERFORMED IN ~~LOC~~ HOSPITAL LAB): SARS Coronavirus 2: NEGATIVE

## 2019-08-05 LAB — ETHANOL: Alcohol, Ethyl (B): <10 mg/dL (ref <10)

## 2019-08-05 LAB — ACETAMINOPHEN LEVEL: Acetaminophen (Tylenol), Serum: <10 ug/mL — ABNORMAL LOW (ref 10–30)

## 2019-08-05 LAB — SALICYLATE LEVEL: Salicylate Lvl: <7 mg/dL — ABNORMAL LOW (ref 7.0–30.0)
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
## 2019-08-05 NOTE — ED Triage Notes (Signed)
Pt reports off his meds for the last couple months. States he had a reaction to the last one's he was taking. Pt reports hearing voices to hurt himself since his mother's passing 2 weeks ago. PT reports has plkan to overdose on pills and attempted to slit his throat this morning. Pt with flat affect. Alert, oriented x4. Calm, cooperative at present.

## 2019-08-05 NOTE — ED Notes (Signed)
Verified w/Sam, [facility] SW, pt is on TTS list to be seen.

## 2019-08-05 NOTE — ED Notes (Signed)
TTS in process 

## 2019-08-05 NOTE — ED Notes (Signed)
Pt asking for med to sleep - advised pt waiting on TTS.

## 2019-08-06 ENCOUNTER — Emergency Department (HOSPITAL_COMMUNITY): Payer: Medicaid Other

## 2019-08-06 MED ORDER — HYDROXYZINE HCL 25 MG PO TABS
50.0000 mg | ORAL_TABLET | Freq: Four times a day (QID) | ORAL | Status: DC | PRN
  Administered 2019-08-06 – 2019-08-09 (×7): 50 mg via ORAL
  Filled 2019-08-06 (×7): qty 1

## 2019-08-06 MED ORDER — ZIPRASIDONE MESYLATE 20 MG IM SOLR
INTRAMUSCULAR | Status: AC
  Filled 2019-08-06: qty 20

## 2019-08-06 MED ORDER — LORAZEPAM 1 MG PO TABS
1.0000 mg | ORAL_TABLET | Freq: Once | ORAL | Status: AC
  Administered 2019-08-06: 1 mg via ORAL
  Filled 2019-08-06: qty 1

## 2019-08-06 MED ORDER — STERILE WATER FOR INJECTION IJ SOLN
INTRAMUSCULAR | Status: AC
  Filled 2019-08-06: qty 10
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
## 2019-08-06 NOTE — ED Notes (Signed)
Breakfast ordered--Jhovani Griswold 

## 2019-08-06 NOTE — ED Notes (Signed)
Disposition: Renaye Rakers, NP, patient meets inpatient criteria. TTS to seek placement. Patient requesting [facility] due to prior positive experience instead of [facility] due to problems incurred during his last stay.

## 2019-08-06 NOTE — ED Notes (Signed)
Pt dinner ordered.

## 2019-08-06 NOTE — ED Notes (Signed)
This RN spoke with [facility] about pt assignment to facility. No record was found of request for placement. This RN then contacted [facility] about placement request and previous note about placement request at [facility]. Counselor informed that pt was asking to go to [facility], if possible. Request obliged by counselor.

## 2019-08-06 NOTE — ED Provider Notes (Addendum)
[name] [name] was seen on rounds today.  Pt initially presented to the ED for complaints of Suicidal.  Currently, the patient is awaiting inpatient psych placement. Pt is requesting [facility] instead of [facility]. No overnight events per nursing.  4:31 PM Nurse informed the patient requests to leave because it appears he is not can get to place at [facility] as he initially requested.  Due to initial chief complaints of SI and HI I have a conversation with patient who is now willing to stay for placement.  He is aware that we would have to obtain an IVC if he decided to leave.  He is currently voluntary.  Ativan given as needed for anxiety.  4:50 PM Nurse informed me that out of frustration, pt punched the wall with his R dominant hand and was bleeding from the knuckles.  Will obtain xray of R hand, dressing applied to wound.    Physical Exam  BP 116/72 (BP Location: Right Arm)   Pulse 61   Temp 98.4 F (36.9 C) (Oral)   Resp 18   Ht 6\' 1"  (1.854 m)   Wt 95.3 kg   SpO2 99%   BMI 27.71 kg/m   Physical Exam Vitals and nursing note reviewed.  Constitutional:      General: He is not in acute distress.    Appearance: He is well-developed.  HENT:     Head: Atraumatic.  Eyes:     Conjunctiva/sclera: Conjunctivae normal.  Musculoskeletal:     Cervical back: Neck supple.  Skin:    Findings: No rash.  Neurological:     General: No focal deficit present.     Mental Status: He is alert.  Psychiatric:        Mood and Affect: Mood normal.     ED Course/Procedures     Procedures  MDM  Pt standing at the hall way, calm and cooperative and waiting for placement at [facility].        [facility], PA-C 08/06/19 1152    08/08/19, PA-C 08/06/19 1632    08/08/19, PA-C 08/06/19 1651    08/08/19, MD 08/06/19 2121

## 2019-08-06 NOTE — BH Assessment (Addendum)
Patient was initially accepted to [facility]. 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   Patient was also accepted at [facility] but later declined by their Wellsite geologist. Patient was reportedly at the facility before and became aggressive. Therefore, the decline was due to acuity.

## 2019-08-06 NOTE — BH Assessment (Signed)
Patient continues to meet inpatient treatment at this time. He has requested bed placement at [facility]. The referral was sent. Awaiting a decision regarding acceptance.
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
## 2019-08-06 NOTE — BH Assessment (Signed)
Patient accepted to [name]. However, due to aggressive and acting out behavior(s) in the Emergency Department requiring chemical restraints, [name] [name] will no accept him into their facility until 08/07/19 (after 9am). Patient will also need to be IVC'd due to concern that he may act once he arrives to their facility and IVC papers must be faxed to [phone]. The accepting provider is Dr. Danton Sewer. Patient will need to present to the [facility] upon arrival. The nurse report # is [phone].

## 2019-08-06 NOTE — ED Notes (Signed)
Lunch Tray Ordered @ 1023. 

## 2019-08-06 NOTE — ED Notes (Addendum)
Pt continues to stand at doorway. Remains non-confrontational, calm, and quiet at this time. Lunch trays delivered short time ago. Pt inquiring about time until transfer to [place]. This RN attempted to [facility] call for update however phone call was not answered. Pt notified that he has been accepted by [facility] but we are currently awaiting room assignment.

## 2019-08-06 NOTE — ED Notes (Signed)
Pt informed that he will not be going to  [facility]. Pt visibly disappointed with news. No aggressive behavior noted. This RN offered resources to occupy his time but pt declined. Continues to stand in doorway of room, occasionally pacing around room.

## 2019-08-07 LAB — LITHIUM LEVEL: Lithium Lvl: <0.06 mmol/L — ABNORMAL LOW (ref 0.60–1.20)

## 2019-08-07 MED ORDER — LITHIUM CARBONATE ER 450 MG PO TBCR
450.0000 mg | EXTENDED_RELEASE_TABLET | Freq: Two times a day (BID) | ORAL | Status: DC
  Administered 2019-08-07 – 2019-08-10 (×6): 450 mg via ORAL
  Filled 2019-08-07 (×8): qty 1

## 2019-08-07 MED ORDER — BENZTROPINE MESYLATE 1 MG PO TABS
0.5000 mg | ORAL_TABLET | Freq: Two times a day (BID) | ORAL | Status: DC
  Administered 2019-08-07 – 2019-08-10 (×6): 0.5 mg via ORAL
  Filled 2019-08-07 (×7): qty 1

## 2019-08-07 MED ORDER — FLUOXETINE HCL 10 MG PO CAPS
10.0000 mg | ORAL_CAPSULE | Freq: Every day | ORAL | Status: DC
  Administered 2019-08-07 – 2019-08-10 (×4): 10 mg via ORAL
  Filled 2019-08-07 (×4): qty 1

## 2019-08-07 MED ORDER — ACETAMINOPHEN 500 MG PO TABS
1000.0000 mg | ORAL_TABLET | Freq: Four times a day (QID) | ORAL | Status: DC | PRN
  Administered 2019-08-07 – 2019-08-10 (×4): 1000 mg via ORAL
  Filled 2019-08-07 (×4): qty 2

## 2019-08-07 MED ORDER — ARIPIPRAZOLE ER 400 MG IM SRER
400.0000 mg | INTRAMUSCULAR | Status: DC
  Administered 2019-08-07: 400 mg via INTRAMUSCULAR
  Filled 2019-08-07 (×2): qty 2

## 2019-08-07 NOTE — ED Provider Notes (Signed)
Emergency Medicine Observation Re-evaluation Note  [name] [name] is a 19 y.o. male, seen on rounds today.  Pt initially presented to the ED for complaints of Suicidal Currently patient is not IVC and is here voluntarily,  patient is alert and does not appear to be in any acute distress.  Lab work and imaging were reviewed.  No indication for further lab work or imaging at this time.  A psychiatric assessment was performed in this patient by Denzil Magnuson NP this morning  who recommends inpatient psychiatric admission due to high risk for suicide.  She started patient on Abilify, Cogentin, Prozac, lithium.  Recommends that lithium level should be repeated in 5 days to monitor for therapeutic response. she is currently checking for are beds available at [facility], if there are no beds available she will refax out to other mental facilities for open beds. Currently looking at facility in [facility] as requested by patient.  Spoke with nurse, does not report any issues at this time.  Physical Exam  BP 120/73 (BP Location: Right Arm)   Pulse 79   Temp 97.8 F (36.6 C) (Axillary)   Resp 16   Ht 6\' 1"  (1.854 m)   Wt 95.3 kg   SpO2 100%   BMI 27.71 kg/m  Physical Exam Vitals and nursing note reviewed.  Constitutional:      General: He is not in acute distress.    Appearance: Normal appearance. He is not ill-appearing or diaphoretic.  HENT:     Head: Normocephalic and atraumatic.     Nose: No congestion or rhinorrhea.  Eyes:     General: No scleral icterus.       Right eye: No discharge.        Left eye: No discharge.     Conjunctiva/sclera: Conjunctivae normal.  Pulmonary:     Effort: Pulmonary effort is normal. No respiratory distress.     Breath sounds: Normal breath sounds. No wheezing.  Musculoskeletal:     Cervical back: Neck supple.     Right lower leg: No edema.     Left lower leg: No edema.  Skin:    General: Skin is warm and dry.     Coloration: Skin is not jaundiced or  pale.  Neurological:     Mental Status: He is alert.  Psychiatric:        Mood and Affect: Mood normal.     ED Course / MDM  EKG:    I have reviewed the labs performed to date as well as medications administered while in observation.  Recent changes in the last 24 hours. Plan  Current plan is for await for bed placement at a  [facility] facility, psychiatrist has evaluated the patient and is looking for available placement. Patient is not under full IVC at this time.   [facility], PA-C 08/07/19 1042    08/09/19, MD 08/07/19 [phone]
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
## 2019-08-07 NOTE — ED Notes (Signed)
Pt awake, playing card games with sitter, interacting appropriately. Will continue to monitor.

## 2019-08-07 NOTE — ED Notes (Signed)
Tele psych machine at bedside 

## 2019-08-07 NOTE — ED Notes (Signed)
Pt awake, requesting to take a shower, shower supplies provided, sitter present. Will continue to monitor.

## 2019-08-07 NOTE — ED Notes (Signed)
Dinner ordered 

## 2019-08-08 MED ORDER — ZOLPIDEM TARTRATE 5 MG PO TABS
10.0000 mg | ORAL_TABLET | Freq: Every evening | ORAL | Status: DC | PRN
  Administered 2019-08-08 (×2): 10 mg via ORAL
  Filled 2019-08-08 (×2): qty 2

## 2019-08-08 NOTE — ED Notes (Signed)
Pr updated with current status regarding placement. This RN spoke with [facility] who [facility] Pt remains docile and cooperative at this time; seemingly accepting of the new information.

## 2019-08-08 NOTE — Progress Notes (Signed)
CSW received a call from pt's RN stating pt was requesting updated.  CSW counseled pt's referrals have been sent out and the CSW is awaiting return call from facilities with a decision.  CSW will continue to follow for D/C needs.  [name] [name]. Tyhesha Dutson  MSW, LCSW, LCAS, CCS Transitions of Care Clinical Social Worker Care Coordination Department Ph: [phone]

## 2019-08-08 NOTE — ED Notes (Signed)
Breakfast Ordered 

## 2019-08-08 NOTE — Progress Notes (Signed)
Pt faxed to the following inpatient psychiatric hospitals for review:  [facility]  [facility]  [facility]  [facility]  [facility]  [facility]  [facility]     Disposition will continue to follow.   Wells Guiles, LCSW, LCAS Disposition CSW [facility] [phone]

## 2019-08-08 NOTE — ED Notes (Signed)
Patient denies pain and is resting comfortably.  

## 2019-08-08 NOTE — ED Provider Notes (Signed)
Emergency Medicine Observation Re-evaluation Note  [name] [name] is a 19 y.o. male, seen on rounds today.  Pt initially presented to the ED for complaints of Suicidal Currently, the patient is ambulatory to the bathroom and cooperative, but also appears anxious.   Physical Exam  BP 126/80 (BP Location: Right Arm)   Pulse 84   Temp 97.8 F (36.6 C) (Oral)   Resp 18   Ht 6\' 1"  (1.854 m)   Wt 95.3 kg   SpO2 100%   BMI 27.71 kg/m  Physical Exam Ambulatory with steady gate. Anxious with flat affect.  ED Course / MDM  EKG:EKG Interpretation  Date/Time:  Thursday August 07 2019 10:49:06 EDT Ventricular Rate:  78 PR Interval:  154 QRS Duration: 84 QT Interval:  370 QTC Calculation: 421 R Axis:   77 Text Interpretation: Normal sinus rhythm with sinus arrhythmia Normal ECG since last tracing no significant change Confirmed by [id number] [phone]) on 08/07/2019 2:00:47 PM    I have reviewed the labs performed to date as well as medications administered while in observation.  Recent changes in the last 24 hours include medications last given yesterday, he is requesting medication "to calm my nerves" Plan  Current plan is for placement.  Patient is under full IVC at this time.   08/09/2019, PA-C 08/08/19 1132    08/10/19, MD 08/08/19 1134

## 2019-08-08 NOTE — ED Notes (Signed)
Dinner tray delivered.

## 2019-08-09 ENCOUNTER — Encounter (HOSPITAL_COMMUNITY): Payer: Self-pay | Admitting: Registered Nurse

## 2019-08-09 DIAGNOSIS — F79 Unspecified intellectual disabilities: Secondary | ICD-10-CM

## 2019-08-09 NOTE — ED Notes (Signed)
Breakfast ordered 

## 2019-08-09 NOTE — ED Provider Notes (Addendum)
Emergency Medicine Observation Re-evaluation Note  [name] [name] is a 19 y.o. male, seen on rounds today.  Pt initially presented to the ED for complaints of Suicidal Currently, the patient is sleeping comfortably no acute distress.  Is arousable to voice.  Reports that he did not want to eat breakfast because he was not hungry but is looking forward to lunch.  He has been holding in the emergency department over 96 hours at this time.  He denies any concerns currently.  Per nursing staff no current needs.  Physical Exam  BP (!) 112/59 (BP Location: Left Arm)   Pulse 60   Temp 98.1 F (36.7 C) (Oral)   Resp 18   Ht 6\' 1"  (1.854 m)   Wt 95.3 kg   SpO2 99%   BMI 27.71 kg/m  Physical Exam Constitutional:      General: He is not in acute distress.    Appearance: Normal appearance. He is well-developed. He is not ill-appearing or diaphoretic.  HENT:     Head: Normocephalic and atraumatic.  Eyes:     General: Vision grossly intact. Gaze aligned appropriately.     Pupils: Pupils are equal, round, and reactive to light.  Neck:     Trachea: Trachea and phonation normal.  Pulmonary:     Effort: Pulmonary effort is normal. No respiratory distress.  Musculoskeletal:        General: Normal range of motion.     Cervical back: Normal range of motion.  Skin:    General: Skin is warm and dry.  Neurological:     Mental Status: He is alert.     GCS: GCS eye subscore is 4. GCS verbal subscore is 5. GCS motor subscore is 6.     Comments: Speech is clear and goal oriented, follows commands Major Cranial nerves without deficit, no facial droop Moves extremities without ataxia, coordination intact  Psychiatric:        Behavior: Behavior normal.     ED Course / MDM  EKG:EKG Interpretation  Date/Time:  Thursday August 07 2019 10:49:06 EDT Ventricular Rate:  78 PR Interval:  154 QRS Duration: 84 QT Interval:  370 QTC Calculation: 421 R Axis:   77 Text Interpretation: Normal sinus rhythm  with sinus arrhythmia Normal ECG since last tracing no significant change Confirmed by [id number] [phone]) on 08/07/2019 2:00:47 PM    I have reviewed the labs performed to date as well as medications administered while in observation.  Recent changes in the last 24 hours include.  No labs within the last 24 hours.  Review of prior labs obtained on July 20.  CBC within normal limits.  Salicylate ethanol and Tylenol levels are negative.  CMP shows no emergent electrolyte derangements, AKI, emergent LFT elevations or gap.  UDS showed THC.  Covid test negative.  Lithium level low.  Vital signs from around noon today are unremarkable. Plan  Current plan is for awaiting placement.   July 22, PA-C 08/09/19 1225    08/11/19 08/09/19 1226    08/11/19, MD 08/11/19 [phone]

## 2019-08-09 NOTE — ED Notes (Signed)
Pt has been standing in doorway of room most of day - chair given to pt.

## 2019-08-09 NOTE — ED Notes (Signed)
Pt noted w/scratches to left posterior forearm - no bleeding/swelling noted - states had broken toothbrush 3 days ago and scratched himself w/it. Denies SI/HI - denies wanting to hurt self now.

## 2019-08-09 NOTE — Progress Notes (Signed)
CSW contacted referral facilities with the following results:  Still reviewing: [facility]- no answer [facility]- no answer [facility]- no answer [facility]- no answer [facility]  Declined: [facility]- at capacity [facility]- at capacity [facility]- at capacity [facility]- no programming  TTS will continue to seek bed placement.   Vilma Meckel. Algis Greenhouse, MSW, LCSW Clinical Social Work/Disposition Phone: [phone] Fax: [phone]

## 2019-08-09 NOTE — ED Notes (Signed)
Pt threw dinner tray away - states he is not hungry.

## 2019-08-09 NOTE — ED Notes (Signed)
Pt asking if he has an order for sleep medication d/t states has not been able to sleep - advised pt yes.

## 2019-08-10 ENCOUNTER — Encounter (HOSPITAL_COMMUNITY): Payer: Self-pay | Admitting: Registered Nurse

## 2019-08-10 MED ORDER — FLUOXETINE HCL 10 MG PO CAPS: 10.0000 mg | ORAL_CAPSULE | Freq: Every day | ORAL | 0 refills | Status: DC

## 2019-08-10 MED ORDER — LITHIUM CARBONATE ER 450 MG PO TBCR: 450.0000 mg | EXTENDED_RELEASE_TABLET | Freq: Two times a day (BID) | ORAL | 2 refills | Status: DC

## 2019-08-10 MED ORDER — BENZTROPINE MESYLATE 0.5 MG PO TABS: 0.5000 mg | ORAL_TABLET | Freq: Two times a day (BID) | ORAL | 2 refills | Status: DC

## 2019-08-10 NOTE — ED Notes (Signed)
Visually checked on Pt pt sleeping normal breathing pattern

## 2019-08-10 NOTE — ED Notes (Signed)
Pt provided with crayons and paper. Calm and cooperative at this time.

## 2019-08-10 NOTE — ED Notes (Signed)
Breakfast ordered 

## 2019-08-10 NOTE — ED Notes (Signed)
Pt c/o slight headache. Tylenol given.

## 2019-08-10 NOTE — ED Notes (Signed)
IVC papers Rescinded by Dr Rodena Medin.

## 2019-08-10 NOTE — Progress Notes (Signed)
CSW consulted for CST resources and referral for patient's discharge. CSW provided local CST providers in patient's discharge paperwork, however a referral in unable to be completed on Sunday.
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
## 2019-08-10 NOTE — ED Notes (Signed)
Pt up early, asked pt if they would like to go back to bed. Pt stated that they were not tired. This Clinical research associate provided patient 6 crayons and 4 word searches. Pt now sitting up on his bed, doing his word searches. Will continue to monitor pt.

## 2019-08-10 NOTE — ED Notes (Signed)
Pt. Currently taking a shower at the moment. Pt has been calm and cooperative, reminded of the rules when taking a shower. Pt verbally stated they understood. Will continue to monitor pt.

## 2019-08-10 NOTE — ED Notes (Signed)
Pt.s lunch tray has been ordered

## 2019-08-10 NOTE — ED Notes (Signed)
Pt.s lunch tray arrived. Will continue to monitor pt.

## 2019-08-10 NOTE — ED Notes (Signed)
Visually checked on Pt. Pt was sleeping; normal breathing pattern

## 2019-08-10 NOTE — ED Notes (Signed)
Visually checked in on Pt. Pt was sleeping normal breathing pattern

## 2019-08-10 NOTE — Discharge Instructions (Signed)
You were seen at [facility] and cleared for discharge by our treatment team. They have recommended follow-up with a CST team to assist in managing symptoms  [facility] [address] [place], [place] [address] Phone: [phone] Fax: [phone] Email: [email or web address]  [facility] [phone]  Fax: [phone] Email: [email or web address] [address] [place], [place] [address]

## 2019-08-10 NOTE — ED Notes (Signed)
Pt talking to the TTS cart.

## 2019-08-10 NOTE — ED Notes (Signed)
Visually checked in on Pt. Pt was sleeping with normal breathing pattern °

## 2019-08-10 NOTE — ED Notes (Signed)
Pt made his own bed, took a shower and brushed his teeth. Toothbrush taken out of his room after use, due to previous events. Pt also brushed hir hair, and the hair comb was also taken out of his room due to previous events. Pt has been compliant and cooperative. Will continue to monitor pt.

## 2019-08-10 NOTE — ED Notes (Signed)
Pt given a sprite to drink. Offered a snack, but pt did not want one. Will continue to monitor pt.

## 2019-08-10 NOTE — ED Notes (Signed)
Pt updated on POC. Agreeable at this time.

## 2019-08-15 ENCOUNTER — Ambulatory Visit (HOSPITAL_COMMUNITY)
Admission: EM | Admit: 2019-08-15 | Discharge: 2019-08-15 | Disposition: A | Discharge status: Home / Self Care | Payer: Medicaid Other | Attending: Psychiatry | Admitting: Psychiatry

## 2019-08-15 ENCOUNTER — Other Ambulatory Visit: Payer: Self-pay

## 2019-08-15 DIAGNOSIS — F909 Attention-deficit hyperactivity disorder, unspecified type: Secondary | ICD-10-CM | POA: Insufficient documentation

## 2019-08-15 DIAGNOSIS — F250 Schizoaffective disorder, bipolar type: Secondary | ICD-10-CM | POA: Insufficient documentation

## 2019-08-15 DIAGNOSIS — J45909 Unspecified asthma, uncomplicated: Secondary | ICD-10-CM | POA: Insufficient documentation

## 2019-08-15 DIAGNOSIS — Z87891 Personal history of nicotine dependence: Secondary | ICD-10-CM | POA: Insufficient documentation

## 2019-08-15 DIAGNOSIS — X838XXA Intentional self-harm by other specified means, initial encounter: Secondary | ICD-10-CM | POA: Insufficient documentation

## 2019-08-15 DIAGNOSIS — Z20822 Contact with and (suspected) exposure to covid-19: Secondary | ICD-10-CM | POA: Insufficient documentation

## 2019-08-15 DIAGNOSIS — F79 Unspecified intellectual disabilities: Secondary | ICD-10-CM | POA: Insufficient documentation

## 2019-08-15 DIAGNOSIS — Z79899 Other long term (current) drug therapy: Secondary | ICD-10-CM | POA: Insufficient documentation

## 2019-08-15 DIAGNOSIS — Z915 Personal history of self-harm: Secondary | ICD-10-CM | POA: Insufficient documentation

## 2019-08-15 DIAGNOSIS — Z560 Unemployment, unspecified: Secondary | ICD-10-CM | POA: Insufficient documentation

## 2019-08-15 DIAGNOSIS — T1491XA Suicide attempt, initial encounter: Secondary | ICD-10-CM | POA: Insufficient documentation

## 2019-08-15 LAB — COMPREHENSIVE METABOLIC PANEL
ALT: 33 U/L (ref 0–44)
AST: 22 U/L (ref 15–41)
Albumin: 4.5 g/dL (ref 3.5–5.0)
Alkaline Phosphatase: 45 U/L (ref 38–126)
Anion gap: 10 (ref 5–15)
BUN: 7 mg/dL (ref 6–20)
CO2: 26 mmol/L (ref 22–32)
Calcium: 9.8 mg/dL (ref 8.9–10.3)
Chloride: 103 mmol/L (ref 98–111)
Creatinine, Ser: 0.79 mg/dL (ref 0.61–1.24)
GFR calc Af Amer: >60 mL/min (ref >60)
GFR calc non Af Amer: >60 mL/min (ref >60)
Glucose, Bld: 77 mg/dL (ref 70–99)
Potassium: 4.2 mmol/L (ref 3.5–5.1)
Sodium: 139 mmol/L (ref 135–145)
Total Bilirubin: 0.8 mg/dL (ref 0.3–1.2)
Total Protein: 7.9 g/dL (ref 6.5–8.1)

## 2019-08-15 LAB — CBC WITH DIFFERENTIAL/PLATELET
Abs Immature Granulocytes: 0.07 10*3/uL (ref 0.00–0.07)
Basophils Absolute: 0.1 10*3/uL (ref 0.0–0.1)
Basophils Relative: 1 %
Eosinophils Absolute: 0.2 10*3/uL (ref 0.0–0.5)
Eosinophils Relative: 2 %
HCT: 49.7 % (ref 39.0–52.0)
Hemoglobin: 16.3 g/dL (ref 13.0–17.0)
Immature Granulocytes: 1 %
Lymphocytes Relative: 21 %
Lymphs Abs: 2 10*3/uL (ref 0.7–4.0)
MCH: 31.3 pg (ref 26.0–34.0)
MCHC: 32.8 g/dL (ref 30.0–36.0)
MCV: 95.4 fL (ref 80.0–100.0)
Monocytes Absolute: 0.8 10*3/uL (ref 0.1–1.0)
Monocytes Relative: 8 %
Neutro Abs: 6.5 10*3/uL (ref 1.7–7.7)
Neutrophils Relative %: 67 %
Platelets: 287 10*3/uL (ref 150–400)
RBC: 5.21 MIL/uL (ref 4.22–5.81)
RDW: 12.9 % (ref 11.5–15.5)
WBC: 9.5 10*3/uL (ref 4.0–10.5)
nRBC: 0 % (ref 0.0–0.2)

## 2019-08-15 LAB — POC SARS CORONAVIRUS 2 AG -  ED: SARS Coronavirus 2 Ag: NEGATIVE

## 2019-08-15 LAB — POCT URINE DRUG SCREEN - MANUAL ENTRY (I-SCREEN)
POC Amphetamine UR: NOT DETECTED
POC Buprenorphine (BUP): NOT DETECTED
POC Cocaine UR: NOT DETECTED
POC Marijuana UR: POSITIVE — AB
POC Methadone UR: NOT DETECTED
POC Methamphetamine UR: NOT DETECTED
POC Morphine: NOT DETECTED
POC Oxazepam (BZO): NOT DETECTED
POC Oxycodone UR: NOT DETECTED
POC Secobarbital (BAR): NOT DETECTED

## 2019-08-15 LAB — LIPID PANEL
Cholesterol: 179 mg/dL (ref 0–200)
HDL: 45 mg/dL (ref >40)
LDL Cholesterol: 120 mg/dL — ABNORMAL HIGH (ref 0–99)
Total CHOL/HDL Ratio: 4 RATIO
Triglycerides: 68 mg/dL (ref <150)
VLDL: 14 mg/dL (ref 0–40)

## 2019-08-15 LAB — HEMOGLOBIN A1C
Hgb A1c MFr Bld: 5.2 % (ref 4.8–5.6)
Mean Plasma Glucose: 102.54 mg/dL

## 2019-08-15 LAB — POC SARS CORONAVIRUS 2 AG: SARS Coronavirus 2 Ag: NEGATIVE

## 2019-08-15 LAB — TSH: TSH: 1.214 u[IU]/mL (ref 0.350–4.500)

## 2019-08-15 LAB — LITHIUM LEVEL: Lithium Lvl: <0.06 mmol/L — ABNORMAL LOW (ref 0.60–1.20)

## 2019-08-15 LAB — SARS CORONAVIRUS 2 BY RT PCR (HOSPITAL ORDER, PERFORMED IN ~~LOC~~ HOSPITAL LAB): SARS Coronavirus 2: NEGATIVE

## 2019-08-15 LAB — ETHANOL: Alcohol, Ethyl (B): <10 mg/dL (ref <10)

## 2019-08-15 MED ORDER — LITHIUM CARBONATE ER 450 MG PO TBCR: 450.0000 mg | EXTENDED_RELEASE_TABLET | Freq: Two times a day (BID) | ORAL | 0 refills | Status: DC

## 2019-08-15 MED ORDER — ALUM & MAG HYDROXIDE-SIMETH 200-200-20 MG/5ML PO SUSP: 30.0000 mL | ORAL | Status: DC | PRN

## 2019-08-15 MED ORDER — ARIPIPRAZOLE 10 MG PO TABS
10.0000 mg | ORAL_TABLET | Freq: Every day | ORAL | Status: DC
  Administered 2019-08-15: 10 mg via ORAL
  Filled 2019-08-15: qty 1

## 2019-08-15 MED ORDER — MAGNESIUM HYDROXIDE 400 MG/5ML PO SUSP: 30.0000 mL | Freq: Every day | ORAL | Status: DC | PRN

## 2019-08-15 MED ORDER — ARIPIPRAZOLE 10 MG PO TABS: 10.0000 mg | ORAL_TABLET | Freq: Every day | ORAL | 0 refills | Status: DC

## 2019-08-15 MED ORDER — BENZTROPINE MESYLATE 0.5 MG PO TABS
0.5000 mg | ORAL_TABLET | Freq: Two times a day (BID) | ORAL | Status: DC
  Administered 2019-08-15: 0.5 mg via ORAL
  Filled 2019-08-15: qty 1

## 2019-08-15 MED ORDER — BENZTROPINE MESYLATE 0.5 MG PO TABS: 0.5000 mg | ORAL_TABLET | Freq: Two times a day (BID) | ORAL | 0 refills | Status: DC

## 2019-08-15 MED ORDER — LITHIUM CARBONATE ER 300 MG PO TBCR
300.0000 mg | EXTENDED_RELEASE_TABLET | Freq: Two times a day (BID) | ORAL | Status: DC
  Administered 2019-08-15: 300 mg via ORAL
  Filled 2019-08-15: qty 1

## 2019-08-15 MED ORDER — FLUOXETINE HCL 10 MG PO CAPS
10.0000 mg | ORAL_CAPSULE | Freq: Every day | ORAL | Status: DC
  Administered 2019-08-15: 10 mg via ORAL
  Filled 2019-08-15: qty 1

## 2019-08-15 MED ORDER — FLUOXETINE HCL 10 MG PO CAPS: 10.0000 mg | ORAL_CAPSULE | Freq: Every day | ORAL | 0 refills | Status: DC

## 2019-08-15 MED ORDER — ACETAMINOPHEN 325 MG PO TABS: 650.0000 mg | ORAL_TABLET | Freq: Four times a day (QID) | ORAL | Status: DC | PRN

## 2019-08-15 MED ORDER — ARIPIPRAZOLE ER 400 MG IM SRER: 400.0000 mg | INTRAMUSCULAR | 0 refills | Status: DC

## 2019-08-15 NOTE — ED Notes (Signed)
Pt admitted to observation due to SUA. Pt stated, "I tried to hang myself this morning but my brother stopped me because I was depressed because my mother died in May 17, 2022. I'm having a hard time dealing with that". Pt presents sad, soft spoken, depressed but cooperative with staff. Eating lunch at present. Requesting to take a shower. Safety maintained.

## 2019-08-15 NOTE — ED Notes (Signed)
Pt discharged in no acute distress. Pt escorted out via staff to safe transport service carrier for transport to home. Safety maintained.

## 2019-08-15 NOTE — ED Notes (Signed)
Pt is watching tv.

## 2019-08-15 NOTE — ED Notes (Signed)
RN reviewed discharge instructions on AVS with pt. Pt verbalized understanding of all discharge instructions. Pt's Aunt Sheran Lawless made aware of discharge instructions. Pt denies SI/HI at present. RN called Safe Transport service to carry pt to home address. Pt made aware. Safety maintained.
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
## 2019-08-15 NOTE — Discharge Instructions (Signed)
Take medications as prescribed Keep scheduled appointments 

## 2019-08-15 NOTE — BH Assessment (Signed)
Comprehensive Clinical Assessment (CCA) Screening, Triage and Referral Note  08/15/2019 [name] [name] [id number]   [name] [name] 19 year old male with history of presenting to the [facility] with complaints of suicidal ideations triggered by auditory hallucinations. Patient most recent visit 08/09/2019 with similar complaints. Patient report when he woke up this morning feeling suicidal triggered by auditory hallucinations and he attempted to hang himself in his backyard by a rope. Report his god-brother stopped him. Denied homicidal ideations and denied visual hallucinations. Patient denied taking medication as prescribed. Report smoking THC with last usage 2-weeks ago.   During evaluation [name] is alert/oriented x 4; calm/cooperative; and mood is congruent with affect.  He is responding to internal and delusional thoughts.  Patient denies homicidal ideation and visual hallucinations.  Patient answered question appropriately. Patient denied taking his medication as prescribed.   Disposition: [name], NP, recommend overnight observation at [facility]    Visit Diagnosis:    ICD-10-CM   1. Schizoaffective disorder, bipolar type (HCC)  F25.0   2. Suicidal behavior with attempted self-injury (HCC)  T14.91XA   3. Intellectual disability  F79     Patient Reported Information How did you hear about [place]? Self (patient walk-in at [facility])   Referral name: [name]   Referral phone number: [phone]  Whom do you see for routine medical problems? Primary Care   Practice/Facility Name: patient could not recall   Practice/Facility Phone Number: No data recorded  Name of Contact: No data recorded  Contact Number: No data recorded  Contact Fax Number: No data recorded  Prescriber Name: No data recorded  Prescriber Address (if known): No data recorded What Is the Reason for Your Visit/Call Today? No data recorded How Long  Has This Been Causing You Problems? <Week (patient woke up stating he was feeling suicidal triggered by hearing voices)  Have You Recently Been in Any Inpatient Treatment (Hospital/Detox/Crisis Center/28-Day Program)? Yes   Name/Location of Program/Hospital:[facility]   How Long Were You There? inpatient tx stay 04/2019   When Were You Discharged? No data recorded Have You Ever Received Services From [facility] Before? Yes   Who Do You [facility] at [facility] 04/2019  Have You Recently Had Any Thoughts About Hurting Yourself? Yes   Are You Planning to Commit Suicide/Harm Yourself At This time?  Yes (patient report he attempted SI this morning via hanging)  Have you Recently Had Thoughts About Hurting Someone [name]? Yes (report a week ago, denied currently)   Explanation: No data recorded Have You Used Any Alcohol or Drugs in the Past 24 Hours? No   How Long Ago Did You Use Drugs or Alcohol?  No data recorded  What Did You Use and How Much? No data recorded What Do You Feel Would Help You the Most Today? Other (Comment) (Patient could not recall, report he is not on his medication)  Do You Currently Have a Therapist/Psychiatrist? Yes   Name of Therapist/Psychiatrist: [name], medication management-patient denied taking his medication as prescribed   Have You Been Recently Discharged From Any Office Practice or Programs? No   Explanation of Discharge From Practice/Program:  No data recorded    CCA Screening Triage Referral Assessment Type of Contact: Face-to-Face   Is this Initial or Reassessment? Initial Assessment   Date Telepsych consult ordered in [facility]:  08/15/19   Time Telepsych consult ordered in [facility]:  No data recorded Patient Reported Information Reviewed? Yes   Patient Left Without  Being Seen? No data recorded  Reason for Not Completing Assessment: No data recorded Collateral Involvement: none reported  Does Patient Have a Court Appointed Legal Guardian? No data  recorded  Name and Contact of Legal Guardian:  Self  If Minor and Not Living with Parent(s), Who has Custody? N/A  Is CPS involved or ever been involved? Never  Is APS involved or ever been involved? Never  Patient Determined To Be At Risk for Harm To Self or Others Based on Review of Patient Reported Information or Presenting Complaint? Yes, for Self-Harm   Method: No data recorded  Availability of Means: No data recorded  Intent: No data recorded  Notification Required: No data recorded  Additional Information for Danger to Others Potential:  No data recorded  Additional Comments for Danger to Others Potential:  No data recorded  Are There Guns or Other Weapons in Your Home?  No    Types of Guns/Weapons: No data recorded   Are These Weapons Safely Secured?                              No data recorded   Who Could Verify You Are Able To Have These Secured:    No data recorded Do You Have any Outstanding Charges, Pending Court Dates, Parole/Probation? No data recorded Contacted To Inform of Risk of Harm To Self or Others: No data recorded Location of Assessment: [facility]  Does Patient Present under Involuntary Commitment? No   IVC Papers Initial File Date: No data recorded  [place] of Residence: [place]  Patient Currently Receiving the Following Services: Medication Management   Determination of Need: Emergent (2 hours)   Options For Referral: Other: Comment (patient referred to [facility] observation unit)   [name], LCAS
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
## 2019-08-15 NOTE — ED Notes (Signed)
Patient belongings in locker 29 

## 2019-08-15 NOTE — ED Notes (Signed)
Pt alert and cooperative. Currently on telephone with Aunt. Denies needs or concerns at present. Safety maintained.

## 2019-08-20 ENCOUNTER — Other Ambulatory Visit: Payer: Self-pay

## 2019-08-20 ENCOUNTER — Encounter (HOSPITAL_COMMUNITY): Payer: Self-pay | Admitting: Registered Nurse

## 2019-08-20 ENCOUNTER — Ambulatory Visit (HOSPITAL_COMMUNITY)
Admission: EM | Admit: 2019-08-20 | Discharge: 2019-08-20 | Disposition: A | Discharge status: Home / Self Care | Payer: Medicaid Other | Attending: Registered Nurse | Admitting: Registered Nurse

## 2019-08-20 DIAGNOSIS — F319 Bipolar disorder, unspecified: Secondary | ICD-10-CM

## 2019-08-20 DIAGNOSIS — F902 Attention-deficit hyperactivity disorder, combined type: Secondary | ICD-10-CM

## 2019-08-20 DIAGNOSIS — R45851 Suicidal ideations: Secondary | ICD-10-CM | POA: Insufficient documentation

## 2019-08-20 DIAGNOSIS — F919 Conduct disorder, unspecified: Secondary | ICD-10-CM

## 2019-08-20 DIAGNOSIS — F79 Unspecified intellectual disabilities: Secondary | ICD-10-CM

## 2019-08-20 DIAGNOSIS — Z634 Disappearance and death of family member: Secondary | ICD-10-CM | POA: Insufficient documentation

## 2019-08-20 DIAGNOSIS — Z1330 Encounter for screening examination for mental health and behavioral disorders, unspecified: Secondary | ICD-10-CM | POA: Diagnosis not present

## 2019-08-20 DIAGNOSIS — F1220 Cannabis dependence, uncomplicated: Secondary | ICD-10-CM

## 2019-08-20 DIAGNOSIS — R454 Irritability and anger: Secondary | ICD-10-CM

## 2019-08-20 MED ORDER — OXCARBAZEPINE 150 MG PO TABS: 150.0000 mg | ORAL_TABLET | Freq: Two times a day (BID) | ORAL | 0 refills | Status: DC

## 2019-08-20 NOTE — ED Provider Notes (Signed)
Behavioral Health Medical Screening Exam  [name] [name] is a 19 y.o. male patient seen face to face by this provider, consulted with Dr. Lucianne Muss, chart reviewed.  Patient presents as a walk in at [facility] voluntary with complaints of suicidal ideation with no plan or intent.  Reporting his primary stressor is that he is unable to control his anger and feels that he has no support since the death of his mother.  Patient lives with his God-bother and biological older brother.  Patient also reports that his aunt (mothers sister) and uncle tries to be there for him but doesn't feel that the support is genuine and that he is only being used.  Patient states that he is not taking his medications as ordered related to them making him feel like he is not hisself.  States that he has outpatient services at [facility] and last saw his therapist "The day after I left the hospital the last time." (which should have been around August 2 (Monday).  Discussed changing his medications and states that he is interested.  Patient is also currently on Abilify Maintena and next dose is due 10/16/19. During patients last visit collateral was gathered from the aunt of patient who informed if patient doesn't get his way then he starts stating that he is suicidal and is back at the hospital and feels that he is doing it to get attention. During evaluation [name] [name] is alert/oriented x 4; calm/cooperative and pleasant; his mood is a little depressed related to feeling he doesn't have the support needed and unable to control his anger; Affect is congruent.  He does not appear to be responding to internal/external stimuli or delusional thoughts.  Patient denies homicidal ideation, psychosis, and paranoia; but has endorsed passive suicidal ideation with no plan or intent.  Stating that he doesn't want to die and is seeking help to make him feel better.  Patient answered question appropriately.        Total Time spent with patient: 45  minutes  Psychiatric Specialty Exam  Presentation  General Appearance:Appropriate for Environment;Casual  Eye Contact:Good  Speech:Clear and Coherent;Normal Rate (Does have a stutter with some words or at the beginning of a sentence at time)  Speech Volume:Normal  Handedness:Right   Mood and Affect  Mood:Depressed (Reporting depressed mood because he is unable to control his anger and feels that he has no support since the death of his mother)  Affect:Congruent;Appropriate   Thought Process  Thought Processes:Coherent;Goal Directed  Descriptions of Associations:Intact  Orientation:Full (Time, Place and Person)  Thought Content:WDL  Hallucinations:None voices telling him to harm himself  Ideas of Reference:None  Suicidal Thoughts:Yes, Passive (Doen't want to hurt himself; just feels down and unsure what he needs to do to get better; nothing has helped and feels that only being in a hospital will help) Without Intent;With Plan  Homicidal Thoughts:No   Sensorium  Memory:Immediate Good;Recent Good;Remote Good  Judgment:Fair  Insight:Fair;Present (Does have insight; but doesn't know what he needs that will help him.  Has things he wants to do but afraid that something will anger him and will either end the activity or he'll get into trouble and stopped from participating)   Executive Functions  Concentration:Good  Attention Span:Good  Recall:Good  Fund of Knowledge:Fair (Intellectual disability; but appears to be high functioning)  Language:Good   Psychomotor Activity  Psychomotor Activity:Normal   Assets  Assets:Communication Skills;Desire for Improvement;Housing;Social Support   Sleep  Sleep:Fair (Reporting he will wake in middle of  night and difficult to fall back to sleep)  Number of hours: No data recorded  Physical Exam: Physical Exam Vitals and nursing note reviewed.  Constitutional:      Appearance: Normal appearance.  HENT:      Head: Normocephalic.     Mouth/Throat:     Comments: Upper 2 front teeth decay Pulmonary:     Effort: Pulmonary effort is normal.  Musculoskeletal:        General: Normal range of motion.     Cervical back: Normal range of motion.  Neurological:     Mental Status: He is alert.  Psychiatric:        Attention and Perception: Attention and perception normal.        Mood and Affect: Affect normal. Mood is depressed.        Speech: Speech normal.        Behavior: Behavior normal. Behavior is cooperative.        Thought Content: Thought content normal. Thought content is not paranoid or delusional. Thought content does not include homicidal or suicidal ideation.        Cognition and Memory: Cognition and memory normal.        Judgment: Judgment is impulsive.    Review of Systems  Psychiatric/Behavioral: Negative for memory loss. Depression: Stable. Hallucinations: Denies at this time. Suicidal ideas: Passive no intent or plan.   Insomnia: Difficulty staying sleep.   All other systems reviewed and are negative.  Blood pressure (!) 143/57, pulse 62, temperature 98.7 F (37.1 C), temperature source Oral, height 6\' 1"  (1.854 m), weight 253 lb (114.8 kg), SpO2 100 %. Body mass index is 33.38 kg/m.  Musculoskeletal: Strength & Muscle Tone: within normal limits Gait & Station: normal Patient leans: N/A   Recommendations:  Patient referred to [facility] an appointment for medication management and therapy to be set up for patient  Discontinue Abilify 10 mg and Lithium 450 mg.  Will start Trileptal 150 mg Bid.  Prescription will be given.  Patient also has an appointment at [facility] Wednesday at 7:50 AM  Disposition:  Psychiatrically Cleared No evidence of imminent risk to self or others at present.   Patient does not meet criteria for psychiatric inpatient admission. Supportive therapy provided about ongoing stressors. Discussed crisis plan, support from social network, calling 911, coming to  the Emergency Department, and calling Suicide Hotline.   Discharge Instructions     Pt to follow-up with [facility] Outpatient as follows:  Appear as walk-in to 2nd floor Outpatient Unit on August 11 at 7:50 AM.  Patient will have open access to both therapy services and med management services that morning.    Based on my evaluation the patient does not appear to have an emergency medical condition.    Miyu Fenderson, NP 08/20/2019, 10:37 AM
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
## 2019-08-20 NOTE — ED Notes (Signed)
Patient belongings in locker number 3.

## 2019-08-20 NOTE — Discharge Instructions (Addendum)
Pt to follow-up with [facility] Outpatient as follows:  Appear as walk-in to 2nd floor Outpatient Unit on August 11 at 7:50 AM.  Patient will have open access to both therapy services and med management services that morning.

## 2019-10-14 ENCOUNTER — Encounter (HOSPITAL_COMMUNITY): Payer: Self-pay | Admitting: Emergency Medicine

## 2019-10-14 ENCOUNTER — Other Ambulatory Visit: Payer: Self-pay

## 2019-10-14 ENCOUNTER — Emergency Department (HOSPITAL_COMMUNITY)
Admission: EM | Admit: 2019-10-14 | Discharge: 2019-10-15 | Disposition: A | Discharge status: Home / Self Care | Payer: Medicaid Other | Attending: Emergency Medicine | Admitting: Emergency Medicine

## 2019-10-14 DIAGNOSIS — T1491XA Suicide attempt, initial encounter: Secondary | ICD-10-CM | POA: Insufficient documentation

## 2019-10-14 DIAGNOSIS — Z20822 Contact with and (suspected) exposure to covid-19: Secondary | ICD-10-CM | POA: Insufficient documentation

## 2019-10-14 DIAGNOSIS — F4323 Adjustment disorder with mixed anxiety and depressed mood: Secondary | ICD-10-CM

## 2019-10-14 DIAGNOSIS — F319 Bipolar disorder, unspecified: Secondary | ICD-10-CM | POA: Diagnosis present

## 2019-10-14 DIAGNOSIS — Z87891 Personal history of nicotine dependence: Secondary | ICD-10-CM | POA: Insufficient documentation

## 2019-10-14 DIAGNOSIS — S51812A Laceration without foreign body of left forearm, initial encounter: Secondary | ICD-10-CM | POA: Diagnosis not present

## 2019-10-14 DIAGNOSIS — X789XXA Intentional self-harm by unspecified sharp object, initial encounter: Secondary | ICD-10-CM | POA: Diagnosis not present

## 2019-10-14 LAB — CBC
HCT: 49.5 % (ref 39.0–52.0)
Hemoglobin: 16.5 g/dL (ref 13.0–17.0)
MCH: 31.6 pg (ref 26.0–34.0)
MCHC: 33.3 g/dL (ref 30.0–36.0)
MCV: 94.8 fL (ref 80.0–100.0)
Platelets: 288 10*3/uL (ref 150–400)
RBC: 5.22 MIL/uL (ref 4.22–5.81)
RDW: 12.9 % (ref 11.5–15.5)
WBC: 9 10*3/uL (ref 4.0–10.5)
nRBC: 0 % (ref 0.0–0.2)

## 2019-10-14 NOTE — ED Triage Notes (Signed)
Patient arrived with 2 [employer] officers : suicidal ideation with auditory hallucinations this week , plans to step in front of moving vehicles , patient also has multiple self inflicted superficial lacerations at left forearm this evening /dressing applied prior to arrival .

## 2019-10-15 LAB — RESPIRATORY PANEL BY RT PCR (FLU A&B, COVID)
Influenza A by PCR: NEGATIVE
Influenza B by PCR: NEGATIVE
SARS Coronavirus 2 by RT PCR: NEGATIVE

## 2019-10-15 LAB — ACETAMINOPHEN LEVEL: Acetaminophen (Tylenol), Serum: <10 ug/mL — ABNORMAL LOW (ref 10–30)

## 2019-10-15 LAB — COMPREHENSIVE METABOLIC PANEL
ALT: 37 U/L (ref 0–44)
AST: 40 U/L (ref 15–41)
Albumin: 5 g/dL (ref 3.5–5.0)
Alkaline Phosphatase: 49 U/L (ref 38–126)
Anion gap: 11 (ref 5–15)
BUN: 6 mg/dL (ref 6–20)
CO2: 25 mmol/L (ref 22–32)
Calcium: 9.9 mg/dL (ref 8.9–10.3)
Chloride: 105 mmol/L (ref 98–111)
Creatinine, Ser: 0.95 mg/dL (ref 0.61–1.24)
GFR calc Af Amer: >60 mL/min (ref >60)
GFR calc non Af Amer: >60 mL/min (ref >60)
Glucose, Bld: 84 mg/dL (ref 70–99)
Potassium: 3.6 mmol/L (ref 3.5–5.1)
Sodium: 141 mmol/L (ref 135–145)
Total Bilirubin: 0.9 mg/dL (ref 0.3–1.2)
Total Protein: 8 g/dL (ref 6.5–8.1)

## 2019-10-15 LAB — ETHANOL: Alcohol, Ethyl (B): <10 mg/dL (ref <10)

## 2019-10-15 LAB — SALICYLATE LEVEL: Salicylate Lvl: <7 mg/dL — ABNORMAL LOW (ref 7.0–30.0)

## 2019-10-15 MED ORDER — HYDROXYZINE HCL 25 MG PO TABS
25.0000 mg | ORAL_TABLET | Freq: Once | ORAL | Status: AC
  Administered 2019-10-15: 25 mg via ORAL
  Filled 2019-10-15: qty 1

## 2019-10-15 MED ORDER — BACITRACIN ZINC 500 UNIT/GM EX OINT
TOPICAL_OINTMENT | Freq: Two times a day (BID) | CUTANEOUS | Status: DC
  Filled 2019-10-15: qty 0.9
  Filled 2019-10-15: qty 1.8

## 2019-10-15 NOTE — Discharge Instructions (Addendum)
It was our pleasure to provide your ER care today - we hope that you feel better.  See resource guide provided for additional therapy/counseling options. Please follow up with therapist/counselor in the coming week.   For mental health issues and/or crisis, you may go directly to the Behavioral Health Urgent Care, it is open 24/7.   Return to ER if worse, new symptoms, high fevers, trouble breathing, or other emergency.

## 2019-10-15 NOTE — ED Notes (Signed)
Pt is showering  ?

## 2019-10-15 NOTE — ED Provider Notes (Signed)
Patient reassessed - pt reports feeling much improved this AM. States has issues with anxiety, and periodically feels stressed. Will give vistaril po.  Pt currently has normal mood and affect. He appears to be thinking clearly, no delusions or hallucinations. Pt denies any thoughts of harm to self, no SI/HI.  He indicates is feeling much better today, and feels ready for d/c. States he is open to pursuing new therapy/counselling options as outpatient.   Patient currently appears stable for d/c.   Rec BH f/u as outpt.   Return precautions provided.      Cathren Laine, MD 10/15/19 [phone]
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
## 2019-10-15 NOTE — ED Notes (Signed)
Patient verbalizes understanding of discharge instructions. Opportunity for questioning and answers were provided. Arm band removed by staff, patient discharged from ED. 
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
## 2019-11-17 ENCOUNTER — Emergency Department (HOSPITAL_COMMUNITY)
Admission: EM | Admit: 2019-11-17 | Discharge: 2019-11-17 | Disposition: A | Discharge status: Other Acute Inpatient Hospital | Payer: Medicaid Other | Attending: Emergency Medicine | Admitting: Emergency Medicine

## 2019-11-17 ENCOUNTER — Encounter (HOSPITAL_COMMUNITY): Payer: Self-pay

## 2019-11-17 ENCOUNTER — Ambulatory Visit (HOSPITAL_COMMUNITY)
Admission: EM | Admit: 2019-11-17 | Discharge: 2019-11-18 | Disposition: A | Discharge status: Home / Self Care | Payer: Medicaid Other | Source: Home / Self Care | Attending: Psychiatry | Admitting: Psychiatry

## 2019-11-17 ENCOUNTER — Other Ambulatory Visit: Payer: Self-pay

## 2019-11-17 ENCOUNTER — Encounter (HOSPITAL_COMMUNITY): Payer: Self-pay | Admitting: *Deleted

## 2019-11-17 DIAGNOSIS — F319 Bipolar disorder, unspecified: Secondary | ICD-10-CM | POA: Diagnosis present

## 2019-11-17 DIAGNOSIS — W260XXA Contact with knife, initial encounter: Secondary | ICD-10-CM | POA: Insufficient documentation

## 2019-11-17 DIAGNOSIS — T43596A Underdosing of other antipsychotics and neuroleptics, initial encounter: Secondary | ICD-10-CM | POA: Insufficient documentation

## 2019-11-17 DIAGNOSIS — Z91128 Patient's intentional underdosing of medication regimen for other reason: Secondary | ICD-10-CM | POA: Insufficient documentation

## 2019-11-17 DIAGNOSIS — R4588 Nonsuicidal self-harm: Secondary | ICD-10-CM | POA: Insufficient documentation

## 2019-11-17 DIAGNOSIS — X781XXD Intentional self-harm by knife, subsequent encounter: Secondary | ICD-10-CM | POA: Insufficient documentation

## 2019-11-17 DIAGNOSIS — J45909 Unspecified asthma, uncomplicated: Secondary | ICD-10-CM | POA: Insufficient documentation

## 2019-11-17 DIAGNOSIS — S51812D Laceration without foreign body of left forearm, subsequent encounter: Secondary | ICD-10-CM | POA: Insufficient documentation

## 2019-11-17 DIAGNOSIS — F250 Schizoaffective disorder, bipolar type: Secondary | ICD-10-CM

## 2019-11-17 DIAGNOSIS — R45851 Suicidal ideations: Secondary | ICD-10-CM | POA: Insufficient documentation

## 2019-11-17 DIAGNOSIS — Z87891 Personal history of nicotine dependence: Secondary | ICD-10-CM | POA: Insufficient documentation

## 2019-11-17 DIAGNOSIS — F79 Unspecified intellectual disabilities: Secondary | ICD-10-CM | POA: Insufficient documentation

## 2019-11-17 DIAGNOSIS — Z20822 Contact with and (suspected) exposure to covid-19: Secondary | ICD-10-CM | POA: Diagnosis not present

## 2019-11-17 DIAGNOSIS — S41112A Laceration without foreign body of left upper arm, initial encounter: Secondary | ICD-10-CM | POA: Insufficient documentation

## 2019-11-17 DIAGNOSIS — F17210 Nicotine dependence, cigarettes, uncomplicated: Secondary | ICD-10-CM | POA: Insufficient documentation

## 2019-11-17 LAB — COMPREHENSIVE METABOLIC PANEL
ALT: 40 U/L (ref 0–44)
AST: 37 U/L (ref 15–41)
Albumin: 4.6 g/dL (ref 3.5–5.0)
Alkaline Phosphatase: 46 U/L (ref 38–126)
Anion gap: 12 (ref 5–15)
BUN: 12 mg/dL (ref 6–20)
CO2: 27 mmol/L (ref 22–32)
Calcium: 9.9 mg/dL (ref 8.9–10.3)
Chloride: 103 mmol/L (ref 98–111)
Creatinine, Ser: 0.82 mg/dL (ref 0.61–1.24)
GFR, Estimated: >60 mL/min (ref >60)
Glucose, Bld: 85 mg/dL (ref 70–99)
Potassium: 3.6 mmol/L (ref 3.5–5.1)
Sodium: 142 mmol/L (ref 135–145)
Total Bilirubin: 0.9 mg/dL (ref 0.3–1.2)
Total Protein: 7.7 g/dL (ref 6.5–8.1)

## 2019-11-17 LAB — ACETAMINOPHEN LEVEL: Acetaminophen (Tylenol), Serum: 14 ug/mL (ref 10–30)

## 2019-11-17 LAB — RESPIRATORY PANEL BY RT PCR (FLU A&B, COVID)
Influenza A by PCR: NEGATIVE
Influenza B by PCR: NEGATIVE
SARS Coronavirus 2 by RT PCR: NEGATIVE

## 2019-11-17 LAB — RAPID URINE DRUG SCREEN, HOSP PERFORMED
Amphetamines: NOT DETECTED
Barbiturates: NOT DETECTED
Benzodiazepines: NOT DETECTED
Cocaine: NOT DETECTED
Opiates: NOT DETECTED
Tetrahydrocannabinol: POSITIVE — AB

## 2019-11-17 LAB — CBC
HCT: 50.2 % (ref 39.0–52.0)
Hemoglobin: 16.5 g/dL (ref 13.0–17.0)
MCH: 31.4 pg (ref 26.0–34.0)
MCHC: 32.9 g/dL (ref 30.0–36.0)
MCV: 95.6 fL (ref 80.0–100.0)
Platelets: 261 10*3/uL (ref 150–400)
RBC: 5.25 MIL/uL (ref 4.22–5.81)
RDW: 12.7 % (ref 11.5–15.5)
WBC: 10 10*3/uL (ref 4.0–10.5)
nRBC: 0 % (ref 0.0–0.2)

## 2019-11-17 LAB — SALICYLATE LEVEL: Salicylate Lvl: <7 mg/dL — ABNORMAL LOW (ref 7.0–30.0)

## 2019-11-17 LAB — ETHANOL: Alcohol, Ethyl (B): <10 mg/dL (ref <10)

## 2019-11-17 MED ORDER — HYDROXYZINE HCL 10 MG PO TABS
10.0000 mg | ORAL_TABLET | Freq: Four times a day (QID) | ORAL | Status: DC | PRN
  Filled 2019-11-17: qty 1

## 2019-11-17 MED ORDER — OXCARBAZEPINE 150 MG PO TABS
150.0000 mg | ORAL_TABLET | Freq: Two times a day (BID) | ORAL | Status: DC
  Administered 2019-11-17 – 2019-11-18 (×2): 150 mg via ORAL
  Filled 2019-11-17 (×2): qty 1

## 2019-11-17 MED ORDER — ACETAMINOPHEN 325 MG PO TABS: 650.0000 mg | ORAL_TABLET | Freq: Four times a day (QID) | ORAL | Status: DC | PRN

## 2019-11-17 MED ORDER — TEMAZEPAM 30 MG PO CAPS
30.0000 mg | ORAL_CAPSULE | Freq: Once | ORAL | Status: DC
  Filled 2019-11-17: qty 1

## 2019-11-17 MED ORDER — MAGNESIUM HYDROXIDE 400 MG/5ML PO SUSP: 30.0000 mL | Freq: Every day | ORAL | Status: DC | PRN

## 2019-11-17 MED ORDER — ALUM & MAG HYDROXIDE-SIMETH 200-200-20 MG/5ML PO SUSP: 30.0000 mL | ORAL | Status: DC | PRN

## 2019-11-17 MED ORDER — HYDROXYZINE HCL 25 MG PO TABS
25.0000 mg | ORAL_TABLET | Freq: Four times a day (QID) | ORAL | Status: DC | PRN
  Administered 2019-11-17: 25 mg via ORAL
  Filled 2019-11-17: qty 1

## 2019-11-17 MED ORDER — ARIPIPRAZOLE 10 MG PO TABS
10.0000 mg | ORAL_TABLET | Freq: Every day | ORAL | Status: DC
  Administered 2019-11-17: 10 mg via ORAL
  Filled 2019-11-17: qty 1

## 2019-11-17 NOTE — ED Provider Notes (Signed)
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 gap 08/15/2019 10  5 - 15 Final   Performed at [facility], [address] [place], [place] [address]    Hgb A1c MFr Bld 08/15/2019 5.2  4.8 - 5.6 % Final   Comment: (NOTE) Pre diabetes:          5.7%-6.4%  Diabetes:              >6.4%  Glycemic control for   <7.0% adults with diabetes    Mean Plasma Glucose 08/15/2019 102.54  mg/dL Final   Performed at [facility], [address] [place], [place] [address]   Alcohol, Ethyl (B) 08/15/2019 <10  <10 mg/dL Final   Comment: (NOTE) Lowest detectable limit for serum alcohol is 10 mg/dL.  For medical purposes only. Performed at [facility], [address] [place], [place] [address]    Cholesterol 08/15/2019 179  0 - 200 mg/dL Final   Triglycerides 60/45/4098 68  <150 mg/dL Final   HDL 11/91/4782 45  >40 mg/dL Final   Total CHOL/HDL Ratio 08/15/2019 4.0  RATIO Final   VLDL 08/15/2019 14  0 - 40 mg/dL Final   LDL Cholesterol 08/15/2019 120* 0 - 99 mg/dL Final   Comment:        Total Cholesterol/HDL:CHD Risk Coronary Heart Disease Risk Table                     Men   Women  1/2 Average Risk   3.4   3.3  Average Risk       5.0   4.4  2 X Average Risk   9.6   7.1  3 X Average Risk  23.4   11.0        Use the calculated Patient Ratio above and the CHD Risk Table to determine the patient's CHD Risk.        ATP III CLASSIFICATION (LDL):  <100     mg/dL   Optimal  956-213  mg/dL   Near or Above                    Optimal  130-159  mg/dL   Borderline  086-578  mg/dL   High  >469     mg/dL   Very High Performed at [facility], [address] [place], [place] [address]    TSH 08/15/2019 1.214  0.350 - 4.500 uIU/mL Final   Comment: Performed by a 3rd Generation assay with a functional sensitivity of <=0.01 uIU/mL. Performed at [facility], [address] [place], [place] [address]    POC Amphetamine UR 08/15/2019 None Detected  None Detected Final   POC Secobarbital (BAR) 08/15/2019 None Detected  None Detected Final   POC Buprenorphine (BUP) 08/15/2019 None Detected  None Detected Final    POC Oxazepam (BZO) 08/15/2019 None Detected  None Detected Final   POC Cocaine UR 08/15/2019 None Detected  None Detected Final   POC Methamphetamine UR 08/15/2019 None Detected  None Detected Final   POC Morphine 08/15/2019 None Detected  None Detected Final   POC Oxycodone UR 08/15/2019 None Detected  None Detected Final   POC Methadone UR 08/15/2019 None Detected  None Detected Final   POC Marijuana UR 08/15/2019 Positive* None Detected Final   Lithium Lvl 08/15/2019 <0.06* 0.60 - 1.20 mmol/L Final   Performed at [facility], [address], [place], [place] [address]   SARS Coronavirus 2 Ag 08/15/2019 Negative  Negative Final   SARS Coronavirus 2 Ag 08/15/2019 NEGATIVE  NEGATIVE Final   Comment: (NOTE) SARS-CoV-2 antigen NOT DETECTED.   Negative results are presumptive.  Negative results do not preclude SARS-CoV-2 infection and should not be used as the sole basis for treatment or other patient management decisions, including infection  control decisions, particularly in the presence of clinical signs and  symptoms consistent with COVID-19, or in those who have been in contact with the virus.  Negative results must be combined with clinical observations, patient history, and epidemiological information. The expected result is Negative.  Fact Sheet for Patients: https://sanders-williams.net/  Fact Sheet for Healthcare Providers: https://martinez.com/   This test is not yet approved or cleared by the [place] FDA and  has been authorized for detection and/or diagnosis of SARS-CoV-2 by FDA under  an Emergency Use Authorization (EUA).  This EUA will remain in effect (meaning this test can be used) for the duration of  the C                          OVID-19 declaration under Section 564(b)(1) of the Act, 21 U.S.C. section 360bbb-3(b)(1), unless the authorization is terminated or revoked sooner.    Admission on 08/05/2019,  Discharged on 08/10/2019  Component Date Value Ref Range Status   Sodium 08/05/2019 139  135 - 145 mmol/L Final   Potassium 08/05/2019 3.9  3.5 - 5.1 mmol/L Final   Chloride 08/05/2019 104  98 - 111 mmol/L Final   CO2 08/05/2019 26  22 - 32 mmol/L Final   Glucose, Bld 08/05/2019 102* 70 - 99 mg/dL Final   Glucose reference range applies only to samples taken after fasting for at least 8 hours.   BUN 08/05/2019 10  6 - 20 mg/dL Final   Creatinine, Ser 08/05/2019 0.89  0.61 - 1.24 mg/dL Final   Calcium 45/62/5638 9.6  8.9 - 10.3 mg/dL Final   Total Protein 93/73/4287 7.5  6.5 - 8.1 g/dL Final   Albumin 68/11/5724 4.5  3.5 - 5.0 g/dL Final   AST 20/35/5974 33  15 - 41 U/L Final   ALT 08/05/2019 54* 0 - 44 U/L Final   Alkaline Phosphatase 08/05/2019 40  38 - 126 U/L Final   Total Bilirubin 08/05/2019 0.8  0.3 - 1.2 mg/dL Final   GFR calc non Af Amer 08/05/2019 >60  >60 mL/min Final   GFR calc Af Amer 08/05/2019 >60  >60 mL/min Final   Anion gap 08/05/2019 9  5 - 15 Final   Performed at [facility], [address] [place], [place] [address]   Alcohol, Ethyl (B) 08/05/2019 <10  <10 mg/dL Final   Comment: (NOTE) Lowest detectable limit for serum alcohol is 10 mg/dL.  For medical purposes only. Performed at [facility], [address], [place], [place] [address]    Salicylate Lvl 08/05/2019 <7.0* 7.0 - 30.0 mg/dL Final   Performed at [facility], [address], [place], [place] [address]   Acetaminophen (Tylenol), Serum 08/05/2019 <10* 10 - 30 ug/mL Final   Comment: (NOTE) Therapeutic concentrations vary significantly. A range of 10-30 ug/mL  may be an effective concentration for many patients. However, some  are best treated at concentrations outside of this range. Acetaminophen concentrations >150 ug/mL at 4 hours after ingestion  and >50 ug/mL at 12 hours after ingestion are often associated with  toxic reactions.  Performed at [facility], [address], [place], [place] [address]    WBC 08/05/2019 6.6  4.0 - 10.5 K/uL Final   RBC 08/05/2019 5.46  4.22 - 5.81 MIL/uL Final   Hemoglobin 08/05/2019 16.8  13.0 - 17.0 g/dL Final   HCT 50/03/7046 51.5  39 - 52 % Final   MCV 08/05/2019 94.3  80.0 - 100.0 fL Final   MCH 08/05/2019 30.8  26.0 - 34.0 pg Final   MCHC 08/05/2019 32.6  30.0 - 36.0 g/dL Final   RDW 88/91/6945 13.0  11.5 - 15.5 % Final   Platelets 08/05/2019 258  150 - 400 K/uL Final   nRBC 08/05/2019 0.0  0.0 - 0.2 % Final   Performed at [facility], [address], [place], [place] [address]   Opiates 08/05/2019 NONE DETECTED  NONE DETECTED Final   Cocaine 08/05/2019 NONE DETECTED  NONE DETECTED Final   Benzodiazepines 08/05/2019 NONE DETECTED  NONE DETECTED Final   Amphetamines 08/05/2019 NONE DETECTED  NONE DETECTED Final   Tetrahydrocannabinol 08/05/2019 POSITIVE* NONE DETECTED Final   Barbiturates 08/05/2019 NONE DETECTED  NONE DETECTED Final   Comment: (NOTE) DRUG SCREEN FOR MEDICAL PURPOSES ONLY.  IF CONFIRMATION IS NEEDED FOR ANY PURPOSE, NOTIFY LAB WITHIN 5 DAYS.  LOWEST DETECTABLE LIMITS FOR URINE DRUG SCREEN Drug Class                     Cutoff (ng/mL) Amphetamine and metabolites    1000 Barbiturate and metabolites    200 Benzodiazepine                 200 Tricyclics and metabolites     300 Opiates and metabolites        300 Cocaine and metabolites        300 THC                            50 Performed at [facility], [address] [place], [place] [address]    SARS Coronavirus 2 08/05/2019 NEGATIVE  NEGATIVE Final   Comment: (NOTE) SARS-CoV-2 target nucleic acids are NOT DETECTED.  The SARS-CoV-2 RNA is generally detectable in upper and lower respiratory specimens during the acute phase of infection. The lowest concentration of SARS-CoV-2 viral copies this assay can detect is 250 copies / mL. A negative result does not preclude SARS-CoV-2  infection and should not be used as the sole basis for treatment or other patient management decisions.  A negative result may occur with improper specimen collection / handling, submission of specimen other than nasopharyngeal swab, presence of viral mutation(s) within the areas targeted by this assay, and inadequate number of viral copies (<250 copies / mL). A negative result must be combined with clinical observations, patient history, and epidemiological information.  Fact Sheet for Patients:   BoilerBrush.com.cy  Fact Sheet for Healthcare Providers: https://pope.com/  This test is not yet approved or                           cleared by the [place] FDA and has been authorized for detection and/or diagnosis of SARS-CoV-2 by FDA under an Emergency Use Authorization (EUA).  This EUA will remain in effect (meaning this test can be used) for the duration of the COVID-19 declaration under Section 564(b)(1) of the Act, 21 U.S.C. section 360bbb-3(b)(1), unless the authorization is terminated or revoked sooner.  Performed at [facility], [address] [place], [place] [address]    Lithium Lvl 08/07/2019 <0.06* 0.60 - 1.20 mmol/L Final   Performed at [facility], [address], [place], [place] [address]    Allergies: Haloperidol and related and Trazodone and nefazodone  PTA Medications: (Not in a hospital admission)   Medical Decision Making  Patient was medically cleared in the emergency department  Start Abilify 10 mg daily for mood stability, first dose now Resume oxcarbazepine 150 mg BID for mood stability    Recommendations  Based on my evaluation the patient does not appear to have an emergency medical condition.   Patient will be placed in the continuous assessment area at [facility] for treatment and stabilization. He will be reevaluated on 11/18/2019. The treatment team will determine disposition  at  that time.      Jackelyn PolingJason A Denise Bramblett, NP 11/18/19  4:12 AM

## 2019-11-17 NOTE — ED Notes (Signed)
Items in locker #1
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
## 2019-11-17 NOTE — BH Assessment (Signed)
[name] Rankin, NP recommends [facility] Observation bed. [facility] called to transport pt.

## 2019-11-17 NOTE — ED Provider Notes (Signed)
Emergency Medicine Observation Re-evaluation Note  [name] [name] is a 19 y.o. male, seen on rounds today.  Pt initially presented to the ED for complaints of ivc Currently, the patient is her for SI and self harm. He is awaiting evaluation by psychiatry. Nursing states patient picked off his dermabond and his wound separated. He is also requesting something for anxiety.  Physical Exam  BP 117/66   Pulse (!) 58   Temp 98.5 F (36.9 C) (Oral)   Resp 20   Ht 6\' 1"  (1.854 m)   Wt 115 kg   SpO2 98%   BMI 33.45 kg/m  Physical Exam General: alert, laying in bed Cardiac: regular rate Lungs: breathing is nonlabored Psych: cooperative  ED Course / MDM  EKG:    I have reviewed the labs performed to date as well as medications administered while in observation.  Recent changes in the last 24 hours include no acute changes.  Plan  Current plan is for nurse to place steri strips. Prn anxiety meds ordered. Have not ordered all of patients prior meds has he has been off of them for several months. Will await medication recommendations from psychiatry. Patient is not under full IVC at this time.   11/17/19 1635    13/01/21, MD 11/17/19 2311
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
## 2019-11-17 NOTE — ED Notes (Signed)
Dinner Tray Ordered @ 1704. 

## 2019-11-17 NOTE — ED Notes (Signed)
Meal tray given to pt.
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
## 2019-11-17 NOTE — ED Notes (Signed)
Pt showered and changed into new purple scrubs
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
## 2019-11-17 NOTE — ED Notes (Signed)
Lunch Tray Ordered @ 1017. 

## 2019-11-17 NOTE — Progress Notes (Signed)
Per Shuvon Rankin NP, patient is to be transported to [facility] for overnight observation.  [facility]. Notified.  CSW notified ED RN Shanda Bumps of transfer.  Ladoris Gene MSW,LCSWA,LCASA Clinical Social Worker  [place] Disposition, CSW [phone] (cell)

## 2019-11-18 MED ORDER — LORAZEPAM 1 MG PO TABS
ORAL_TABLET | ORAL | Status: AC
  Filled 2019-11-18: qty 2

## 2019-11-18 MED ORDER — ARIPIPRAZOLE LAUROXIL ER 675 MG/2.4ML IM PRSY
675.0000 mg | PREFILLED_SYRINGE | Freq: Once | INTRAMUSCULAR | Status: AC
  Administered 2019-11-18: 675 mg via INTRAMUSCULAR

## 2019-11-18 MED ORDER — ARIPIPRAZOLE 15 MG PO TABS
30.0000 mg | ORAL_TABLET | Freq: Once | ORAL | Status: AC
  Administered 2019-11-18: 30 mg via ORAL
  Filled 2019-11-18: qty 2

## 2019-11-18 MED ORDER — NON FORMULARY: 675.0000 mg | Freq: Once | Status: DC

## 2019-11-18 MED ORDER — NON FORMULARY: 441.0000 mg | Freq: Once | Status: DC

## 2019-11-18 MED ORDER — ARIPIPRAZOLE LAUROXIL ER 441 MG/1.6ML IM PRSY
441.0000 mg | PREFILLED_SYRINGE | Freq: Once | INTRAMUSCULAR | Status: AC
  Administered 2019-11-18: 441 mg via INTRAMUSCULAR

## 2019-11-18 MED ORDER — ARIPIPRAZOLE LAUROXIL ER 441 MG/1.6ML IM PRSY: 441.0000 mg | PREFILLED_SYRINGE | Freq: Once | INTRAMUSCULAR | 0 refills | Status: AC

## 2019-11-18 MED ORDER — LORAZEPAM 1 MG PO TABS
2.0000 mg | ORAL_TABLET | Freq: Once | ORAL | Status: AC
  Administered 2019-11-18: 2 mg via ORAL

## 2019-11-18 NOTE — ED Notes (Signed)
Patient A&O x 4, ambulatory. Patient discharged in no acute distress. Patient denied SI/HI, A/VH upon discharge. Patient verbalized understanding of all discharge instructions explained by staff, to include follow up appointments, RX's and safety plan. Pt belongings returned to patient from locker intact. Patient escorted to lobby via staff for transport to destination. Safety maintained.   

## 2019-11-18 NOTE — ED Notes (Signed)
Breakfast given.  

## 2019-11-18 NOTE — ED Provider Notes (Signed)
FBC/OBS ASAP Discharge Summary  Date and Time: 11/18/2019 12:46 PM  Name: [name] [name]  MRN:  [id number]   Discharge Diagnoses:  Final diagnoses:  Schizoaffective disorder, bipolar type (HCC)    Subjective:  Patient state he is feeling "ok" today. Recalls information as below that led to hospitalization. Pt states that he had been living with his brother and that they got into an argument. He declined to provide details about the argument  But says that "we got into it". He states that he often feels mistreated by his brother and he makes him angry. He states that yesterday he had been feeling "depressed all day" and that after the argument with his brother he felt suicidal. . Pt admits to medication non compliance but expresses interest in getting back on the aripiprazole LAI as it helped with the voices. Pt reports vague AH today, states they are present but cant tell what they are saying, denies CAH. Objectively patient does not appear to be RIS. Patient states that he has been "suicidal all my life", although denies active SI currently. He states that his family sometimes makes him feel suicidal because of how they treat him. He denies active HI but admits that his brother makes him angry although they have never got into a physical altercation. He states that he would like to return home to his apartment and feels safe to be with his brother. Pt states that he would like to discuss with him how he makes him feel.    aunt Olevia Bowens who is also his guardian was called at [phone]); attempted to call twice with no response this morning. Left voicemail with call back number and hipaa compliant message. Called back at 12:46 PM . Raymond Lives with brother and they often get into arguments aboutthings. He called her the other night asking to be picked up because Caulder said that his brother makes him mad because he was asking him to  do stuff, asking for him to help with the dishes,etc. Stated  that Augusta's brother has often made comments that aren't nice which doesn't help . Says the other day they got into a fight because his brother ordered pizza and wouldn't give him any. She works in Copywriter, advertising and is going to work now but feels safe for him to return home. He often makes comments about suicide when he gets frustrated. Described Jedi has having poor frustration tolerance. confirmed that home address in epic is correct.    Stay Summary: 19 yo male with schizoaffective disorder, bipolar type and intellectual disability who presented to the ER with law enforcement agter he called 911 and reported that he cut his arm and was going to cut his throat if they did not arrive by 2:15. Pt states that they arrived at 2:16, and when the police arrived her experienced CAH instructing him to grab the officer's gun and shoot himself. Pt also reported in the ER to TTS that he was experiencing HI towards his family with a plan to tie them down, torture them and let them bleed out. Pt states that prior to 911 call he was kicked out of the house by his brother. Patient was evaluated by TTS who recommended that patient be transferred to [facility] for observation. On arrival to [facility], he relays the same story and requests trasportation to [place] to his GF's house. He was restarted on abilify 10 mg overnight. He had prevoiusly been on monthly abilify maintena; unclear when he had last received  this as he reports it was "months ago"- last maintena dose was due 10/16/19 which he denied receiving. Per documentation, he had told another staff member he had been non compliant with all medications since April.. Concern about medication compliance and ability to take 14 days oral overlap. Discussed aristanda adminstration, as it is similar to [facility] but could be give as 2 injections and one time dose of oral medication and then follow up with LAI monthly. Pt amenable. Patient received 665 mg initio, 441 aristada and 30 mg  po dose x1 and then instructed to follow up within a month for LAI. Approximately November 30, was provided Rx for this at discharge  Total Time spent with patient: 30 minutes  Past Psychiatric History: see H&P Past Medical History:  Past Medical History:  Diagnosis Date  . ADHD (attention deficit hyperactivity disorder)   . Aggressive behavior 08/24/2018  . Asthma   . Bipolar 1 disorder (HCC)   . Depression   . Learning disability   . Medical history non-contributory   . Seasonal allergies   . Shock from electroshock gun (taser) 08/24/2018   Patient was Tased by [employer] at [facility] across street due to aggresive and hostile behavior.   No past surgical history on file. Family History: No family history on file. Family Psychiatric History: see H&P Social History:  Social History   Substance and Sexual Activity  Alcohol Use No     Social History   Substance and Sexual Activity  Drug Use Yes  . Frequency: 3.0 times per week  . Types: Marijuana   Comment: 2-3 blunts at a time    Social History   Socioeconomic History  . Marital status: Single    Spouse name: Not on file  . Number of children: Not on file  . Years of education: Not on file  . Highest education level: Not on file  Occupational History  . Occupation: Unemployed  Tobacco Use  . Smoking status: Current Every Day Smoker    Packs/day: 1.00    Types: Cigarettes  . Smokeless tobacco: Never Used  Vaping Use  . Vaping Use: Never used  Substance and Sexual Activity  . Alcohol use: No  . Drug use: Yes    Frequency: 3.0 times per week    Types: Marijuana    Comment: 2-3 blunts at a time  . Sexual activity: Never  Other Topics Concern  . Not on file  Social History Narrative   Pt stated that he lives with his godfather.  He is unemployed, and he is followed by [employer].   Social Determinants of Health   Financial Resource Strain:   . Difficulty of Paying Living Expenses: Not on file  Food Insecurity:    . Worried About Programme researcher, broadcasting/film/video in the Last Year: Not on file  . Ran Out of Food in the Last Year: Not on file  Transportation Needs:   . Lack of Transportation (Medical): Not on file  . Lack of Transportation (Non-Medical): Not on file  Physical Activity:   . Days of Exercise per Week: Not on file  . Minutes of Exercise per Session: Not on file  Stress:   . Feeling of Stress : Not on file  Social Connections:   . Frequency of Communication with Friends and Family: Not on file  . Frequency of Social Gatherings with Friends and Family: Not on file  . Attends Religious Services: Not on file  . Active Member of Clubs or  Organizations: Not on file  . Attends BankerClub or Organization Meetings: Not on file  . Marital Status: Not on file   SDOH:  SDOH Screenings   Alcohol Screen: Low Risk   . Last Alcohol Screening Score (AUDIT): 0  Depression (PHQ2-9): Medium Risk  . PHQ-2 Score: 18  Financial Resource Strain:   . Difficulty of Paying Living Expenses: Not on file  Food Insecurity:   . Worried About Programme researcher, broadcasting/film/videounning Out of Food in the Last Year: Not on file  . Ran Out of Food in the Last Year: Not on file  Housing:   . Last Housing Risk Score: Not on file  Physical Activity:   . Days of Exercise per Week: Not on file  . Minutes of Exercise per Session: Not on file  Social Connections:   . Frequency of Communication with Friends and Family: Not on file  . Frequency of Social Gatherings with Friends and Family: Not on file  . Attends Religious Services: Not on file  . Active Member of Clubs or Organizations: Not on file  . Attends BankerClub or Organization Meetings: Not on file  . Marital Status: Not on file  Stress:   . Feeling of Stress : Not on file  Tobacco Use: High Risk  . Smoking Tobacco Use: Current Every Day Smoker  . Smokeless Tobacco Use: Never Used  Transportation Needs:   . Freight forwarderLack of Transportation (Medical): Not on file  . Lack of Transportation (Non-Medical): Not on file     Has this patient used any form of tobacco in the last 30 days? (Cigarettes, Smokeless Tobacco, Cigars, and/or Pipes) Prescription not provided because: n/a  Current Medications:  Current Facility-Administered Medications  Medication Dose Route Frequency Provider Last Rate Last Admin  . acetaminophen (TYLENOL) tablet 650 mg  650 mg Oral Q6H PRN Nira ConnBerry, Jason A, NP      . alum & mag hydroxide-simeth (MAALOX/MYLANTA) 200-200-20 MG/5ML suspension 30 mL  30 mL Oral Q4H PRN Nira ConnBerry, Jason A, NP      . hydrOXYzine (ATARAX/VISTARIL) tablet 25 mg  25 mg Oral Q6H PRN Nira ConnBerry, Jason A, NP   25 mg at 11/17/19 2139  . magnesium hydroxide (MILK OF MAGNESIA) suspension 30 mL  30 mL Oral Daily PRN Nira ConnBerry, Jason A, NP      . OXcarbazepine (TRILEPTAL) tablet 150 mg  150 mg Oral BID Nira ConnBerry, Jason A, NP   150 mg at 11/18/19 0939  . temazepam (RESTORIL) capsule 30 mg  30 mg Oral Once Jackelyn PolingBerry, Jason A, NP       Current Outpatient Medications  Medication Sig Dispense Refill  . acetaminophen (TYLENOL) 500 MG tablet Take 500-1,000 mg by mouth every 6 (six) hours as needed for mild pain (or headaches).    Melene Muller. [START ON 12/16/2019] ARIPiprazole Lauroxil ER (ARISTADA) 441 MG/1.6ML prefilled syringe Inject 441 mg into the muscle once for 1 dose. 1.5 mL 0  . OXcarbazepine (TRILEPTAL) 150 MG tablet Take 1 tablet (150 mg total) by mouth 2 (two) times daily. 60 tablet 0    PTA Medications: (Not in a hospital admission)   Musculoskeletal  Strength & Muscle Tone: within normal limits Gait & Station: normal Patient leans: N/A  Psychiatric Specialty Exam  Presentation  General Appearance: Appropriate for Environment;Fairly Groomed  Eye Contact:Other (comment);Fair (intermittent)  Speech:Clear and Coherent;Normal Rate  Speech Volume:Decreased  Handedness:Right   Mood and Affect  Mood:Depressed;Worthless  Affect:Appropriate;Blunt;Congruent   Thought Process  Thought Processes:Coherent;Goal  Directed  Descriptions of Associations:Intact  Orientation:Full (Time, Place and Person)  Thought Content:WDL  Hallucinations:Hallucinations: Auditory Description of Auditory Hallucinations: reports experiencing auditory hallucinations this AM; but unable to make out what they are saying, denies CAH  Ideas of Reference:None  Suicidal Thoughts:Suicidal Thoughts: No (passive SI if he were to go back to family) SI Passive Intent and/or Plan: Without Intent;Without Plan  Homicidal Thoughts:Homicidal Thoughts: No   Sensorium  Memory:Immediate Good;Recent Good  Judgment:Other (comment) (limited)  Insight:Other (comment) (limited)   Executive Functions  Concentration:Fair  Attention Span:Fair  Recall:Fair  Fund of Knowledge:Fair  Language:Good   Psychomotor Activity  Psychomotor Activity:Psychomotor Activity: Normal   Assets  Assets:Desire for Improvement;Resilience   Sleep  Sleep:Sleep: Fair   Physical Exam  Physical Exam ROS Blood pressure 117/61, pulse 67, temperature 97.9 F (36.6 C), temperature source Oral, resp. rate 18, SpO2 100 %. There is no height or weight on file to calculate BMI.  Demographic Factors:  Male, Adolescent or young adult, Low socioeconomic status and Unemployed  Loss Factors: loss of mother in april 2021  Historical Factors: NA  Risk Reduction Factors:   Living with another person, especially a relative and Positive therapeutic relationship  Continued Clinical Symptoms:  Previous Psychiatric Diagnoses and Treatments  Cognitive Features That Contribute To Risk:  Intellectual disability  Suicide Risk:  Mild:  Suicidal ideation of limited frequency, intensity, duration, and specificity.  There are no identifiable plans, no associated intent, mild dysphoria and related symptoms, good self-control (both objective and subjective assessment), few other risk factors, and identifiable protective factors, including available and  accessible social support.  Plan Of Care/Follow-up recommendations:  Activity:  as toelrated Diet:  regular Other:     Patient is instructed prior to discharge to: Take all medications as prescribed by his/her mental healthcare provider. Report any adverse effects and or reactions from the medicines to his/her outpatient provider promptly. Patient has been instructed & cautioned: To not engage in alcohol and or illegal drug use while on prescription medicines. In the event of worsening symptoms, patient is instructed to call the crisis hotline, 911 and or go to the nearest ED for appropriate evaluation and treatment of symptoms. To follow-up with his/her primary care provider for your other medical issues, concerns and or health care needs.   Patient was instructed to present for walk in appointment for follow up at the [facility] and informed that his next LAI is due 12/16/2019  Disposition:home  Estella Husk, MD 11/18/2019, 12:46 PM

## 2019-11-18 NOTE — ED Notes (Signed)
Ambulating on unit. Interacting with staff. Safety maintained.

## 2019-11-18 NOTE — Discharge Instructions (Addendum)
Please follow up at this facility- [facility]-by December 16, 2019 to get next aristada dose (your new monthly injection) and to establish care with a provider

## 2019-11-18 NOTE — ED Notes (Signed)
Patient alert and verbal. Patient voices passive SI. Patient able to contract for safety.Monitoring continues.

## 2019-11-21 ENCOUNTER — Other Ambulatory Visit: Payer: Self-pay

## 2019-11-21 ENCOUNTER — Emergency Department (HOSPITAL_COMMUNITY)
Admission: EM | Admit: 2019-11-21 | Discharge: 2019-11-23 | Disposition: A | Discharge status: Home / Self Care | Payer: Medicaid Other | Attending: Emergency Medicine | Admitting: Emergency Medicine

## 2019-11-21 DIAGNOSIS — T1491XA Suicide attempt, initial encounter: Secondary | ICD-10-CM | POA: Diagnosis not present

## 2019-11-21 DIAGNOSIS — J45909 Unspecified asthma, uncomplicated: Secondary | ICD-10-CM | POA: Diagnosis not present

## 2019-11-21 DIAGNOSIS — F250 Schizoaffective disorder, bipolar type: Secondary | ICD-10-CM | POA: Insufficient documentation

## 2019-11-21 DIAGNOSIS — R4589 Other symptoms and signs involving emotional state: Secondary | ICD-10-CM | POA: Diagnosis present

## 2019-11-21 DIAGNOSIS — F79 Unspecified intellectual disabilities: Secondary | ICD-10-CM

## 2019-11-21 DIAGNOSIS — X789XXA Intentional self-harm by unspecified sharp object, initial encounter: Secondary | ICD-10-CM | POA: Insufficient documentation

## 2019-11-21 DIAGNOSIS — Z20822 Contact with and (suspected) exposure to covid-19: Secondary | ICD-10-CM | POA: Diagnosis not present

## 2019-11-21 DIAGNOSIS — F3481 Disruptive mood dysregulation disorder: Secondary | ICD-10-CM | POA: Diagnosis present

## 2019-11-21 DIAGNOSIS — F259 Schizoaffective disorder, unspecified: Secondary | ICD-10-CM | POA: Diagnosis present

## 2019-11-21 DIAGNOSIS — R45851 Suicidal ideations: Secondary | ICD-10-CM

## 2019-11-21 DIAGNOSIS — F17210 Nicotine dependence, cigarettes, uncomplicated: Secondary | ICD-10-CM | POA: Diagnosis not present

## 2019-11-21 DIAGNOSIS — Z7289 Other problems related to lifestyle: Secondary | ICD-10-CM

## 2019-11-21 LAB — RAPID URINE DRUG SCREEN, HOSP PERFORMED
Amphetamines: NOT DETECTED
Barbiturates: NOT DETECTED
Benzodiazepines: NOT DETECTED
Cocaine: NOT DETECTED
Opiates: NOT DETECTED
Tetrahydrocannabinol: POSITIVE — AB

## 2019-11-21 LAB — COMPREHENSIVE METABOLIC PANEL
ALT: 32 U/L (ref 0–44)
AST: 27 U/L (ref 15–41)
Albumin: 4.6 g/dL (ref 3.5–5.0)
Alkaline Phosphatase: 48 U/L (ref 38–126)
Anion gap: 10 (ref 5–15)
BUN: 9 mg/dL (ref 6–20)
CO2: 26 mmol/L (ref 22–32)
Calcium: 9.8 mg/dL (ref 8.9–10.3)
Chloride: 103 mmol/L (ref 98–111)
Creatinine, Ser: 0.88 mg/dL (ref 0.61–1.24)
GFR, Estimated: >60 mL/min (ref >60)
Glucose, Bld: 93 mg/dL (ref 70–99)
Potassium: 3.8 mmol/L (ref 3.5–5.1)
Sodium: 139 mmol/L (ref 135–145)
Total Bilirubin: 0.7 mg/dL (ref 0.3–1.2)
Total Protein: 7.7 g/dL (ref 6.5–8.1)

## 2019-11-21 LAB — CBC
HCT: 51 % (ref 39.0–52.0)
Hemoglobin: 16.7 g/dL (ref 13.0–17.0)
MCH: 31.1 pg (ref 26.0–34.0)
MCHC: 32.7 g/dL (ref 30.0–36.0)
MCV: 95 fL (ref 80.0–100.0)
Platelets: 243 10*3/uL (ref 150–400)
RBC: 5.37 MIL/uL (ref 4.22–5.81)
RDW: 12.4 % (ref 11.5–15.5)
WBC: 6.7 10*3/uL (ref 4.0–10.5)
nRBC: 0 % (ref 0.0–0.2)

## 2019-11-21 LAB — SALICYLATE LEVEL: Salicylate Lvl: <7 mg/dL — ABNORMAL LOW (ref 7.0–30.0)

## 2019-11-21 LAB — RESPIRATORY PANEL BY RT PCR (FLU A&B, COVID)
Influenza A by PCR: NEGATIVE
Influenza B by PCR: NEGATIVE
SARS Coronavirus 2 by RT PCR: NEGATIVE

## 2019-11-21 LAB — ETHANOL: Alcohol, Ethyl (B): <10 mg/dL (ref <10)

## 2019-11-21 LAB — ACETAMINOPHEN LEVEL: Acetaminophen (Tylenol), Serum: <10 ug/mL — ABNORMAL LOW (ref 10–30)

## 2019-11-21 MED ORDER — LORAZEPAM 1 MG PO TABS
1.0000 mg | ORAL_TABLET | Freq: Once | ORAL | Status: AC
  Administered 2019-11-21: 1 mg via ORAL
  Filled 2019-11-21: qty 1

## 2019-11-21 MED ORDER — LORAZEPAM 1 MG PO TABS
1.0000 mg | ORAL_TABLET | Freq: Four times a day (QID) | ORAL | Status: DC | PRN
  Administered 2019-11-21 – 2019-11-23 (×5): 1 mg via ORAL
  Filled 2019-11-21 (×5): qty 1

## 2019-11-21 NOTE — ED Notes (Signed)
Crystal-RN was walking by when the pt notified her that he had a razor blade. She got the attn of Ryan-RN and myself.  Crystal was anchoring his hand to his body to prevent him from cutting himself or anyone else.  We managed to get him to release the blade. It fell to the floor and we kicked it away then picked it up.  He stated that he couldn't help it.  He had no choice. He was tearful and non-combative. Security arrived, we provided him a bed to sit on and security is with him currently.

## 2019-11-21 NOTE — ED Notes (Signed)
The pt wants to be moved into a room he reports that being in the hall  Makes him more anxious

## 2019-11-21 NOTE — ED Notes (Signed)
Pt waved this RN down. Pt states he has the blade on him that he used to cut himself. This RN asked pt to hand over blade to this RN. Pt instead placed blade in his hand and became guarded. Pt gripping blade tightly and this RN placed hand on pt arm. Pt attempted to raise arm to throat stating "I need to do this. I do not have a choice. The voices". Ryan, RN and Josh, RN to pt side to help prevent pt from cutting himself. Blade was removed from pt and security and [employer] to bedside.

## 2019-11-21 NOTE — ED Notes (Signed)
Lunch Tray Ordered @ 1021. 
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
## 2019-11-21 NOTE — BH Assessment (Signed)
Clinician to cal the TTS cart in 10 minutes. Pt is in the hallway, nurse to find a place for pt's assessment to be completed.    Redmond Pulling, MS, [facility], [facility] Triage Specialist [phone]
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
## 2019-11-21 NOTE — ED Notes (Signed)
Pt becoming more anxious. Pt is requesting that he be put into a room because being in the hallway "stresses him out". Pt made aware that there is no beds available at this time. Pt was deescalated and got in the bed. Pt in agreence with taking some medications to help calm him down. Criss Alvine, MD made aware. See new orders.

## 2019-11-21 NOTE — ED Provider Notes (Signed)
[facility] EMERGENCY DEPARTMENT Provider Note   CSN: [id number] Arrival date & time: 11/21/19  0915     History Chief Complaint  Patient presents with  . Suicidal    [name] [name] is a 19 y.o. male.  HPI  Patient with recent behavioral health evaluation, history of schizoaffective disorder, now presents via police after possible suicidal ideation, and definite episode of self-harm. Patient self states that he is interested in getting help, states that he wants to end his life.  He states that he awoke this morning with a knife in his hand, standing around his brother towards whom he has harmful thoughts. Patient notes that he has had worsening depression, increased suicidal ideation recently, including currently, again reiterating that he wants to end his life. Per police the patient was found at a bus stop, threatening to jump in front of traffic.  The patient denies any current physical pain, including about the area of his left forearm where there are obvious superficial lacerations.  He denies any other physical discomfort as well.  He is seemingly taking all medication as directed.  He does have some cognitive disorder as well as psychiatric disease, but seems to answer questions about his current status appropriately.  Past Medical History:  Diagnosis Date  . ADHD (attention deficit hyperactivity disorder)   . Aggressive behavior 08/24/2018  . Asthma   . Bipolar 1 disorder (HCC)   . Depression   . Learning disability   . Medical history non-contributory   . Seasonal allergies   . Shock from electroshock gun (taser) 08/24/2018   Patient was Tased by [employer] at [facility] street due to aggresive and hostile behavior.    Patient Active Problem List   Diagnosis Date Noted  . Intellectual disability 08/09/2019  . Bipolar 1 disorder (HCC) 05/15/2019  . Disruptive behavior   . Schizoaffective disorder, bipolar type (HCC) 08/23/2018  . Schizoaffective  disorder (HCC) 08/20/2018  . Bipolar I disorder, current or most recent episode manic, severe (HCC) 01/05/2018  . Aggressive behavior of adolescent   . DMDD (disruptive mood dysregulation disorder) (HCC) 01/12/2014  . Attention deficit hyperactivity disorder (ADHD), combined type, severe 01/12/2014  . Oppositional defiant disorder 01/12/2014  . 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 Cannabis use disorder, mild, abuse 01/12/2014  . Suicidal behavior 01/04/2014    No past surgical history on file.     No family history on file.  Social History   Tobacco Use  . Smoking status: Current Every Day Smoker    Packs/day: 1.00    Types: Cigarettes  . Smokeless tobacco: Never Used  Vaping Use  . Vaping Use: Never used  Substance Use Topics  . Alcohol use: No  . Drug use: Yes    Frequency: 3.0 times per week    Types: Marijuana    Comment: 2-3 blunts at a time    Home Medications Prior to Admission medications   Medication Sig Start Date End Date Taking? Authorizing Provider  ARIPiprazole Lauroxil ER (ARISTADA) 441 MG/1.6ML prefilled syringe Inject 441 mg into the muscle once for 1 dose. 12/16/19 12/16/19  Estella Husk, MD  OXcarbazepine (TRILEPTAL) 150 MG tablet Take 1 tablet (150 mg total) by mouth 2 (two) times daily. 08/20/19   Rankin, Shuvon B, NP    Allergies    Haloperidol and related and Trazodone and nefazodone  Review of Systems   Review of Systems  Constitutional:  Per HPI, otherwise negative  HENT:       Per HPI, otherwise negative  Respiratory:       Per HPI, otherwise negative  Cardiovascular:       Per HPI, otherwise negative  Gastrointestinal: Negative for vomiting.  Endocrine:       Negative aside from HPI  Genitourinary:       Neg aside from HPI   Musculoskeletal:       Per HPI, otherwise negative  Skin: Positive for wound.  Neurological: Negative for syncope.    Psychiatric/Behavioral: Positive for dysphoric mood, self-injury, sleep disturbance and suicidal ideas. The patient is nervous/anxious.     Physical Exam Updated Vital Signs BP (!) 156/94 (BP Location: Right Arm)   Pulse 74   Temp 99.2 F (37.3 C) (Oral)   Resp 16   SpO2 100%   Physical Exam Vitals and nursing note reviewed.  Constitutional:      General: He is not in acute distress.    Appearance: He is well-developed.  HENT:     Head: Normocephalic and atraumatic.  Eyes:     Conjunctiva/sclera: Conjunctivae normal.  Cardiovascular:     Rate and Rhythm: Normal rate and regular rhythm.  Pulmonary:     Effort: Pulmonary effort is normal. No respiratory distress.     Breath sounds: No stridor.  Abdominal:     General: There is no distension.  Skin:    General: Skin is warm and dry.       Neurological:     Mental Status: He is alert and oriented to person, place, and time.  Psychiatric:        Mood and Affect: Mood is depressed.        Speech: Speech is delayed.        Behavior: Behavior is slowed and withdrawn.        Thought Content: Thought content includes suicidal ideation.      ED Results / Procedures / Treatments   Labs - results reviewed  (all labs ordered are listed, but only abnormal results are displayed) Labs Reviewed  RESPIRATORY PANEL BY RT PCR (FLU A&B, COVID)  COMPREHENSIVE METABOLIC PANEL  ETHANOL  SALICYLATE LEVEL  ACETAMINOPHEN LEVEL  CBC  RAPID URINE DRUG SCREEN, HOSP PERFORMED     Procedures Procedures (including critical care time)  Medications Ordered in ED Medications - No data to display  ED Course  I have reviewed the triage vital signs and the nursing notes.  Pertinent labs & imaging results that were available during my care of the patient were reviewed by me and considered in my medical decision making (see chart for details).     Young adult male with cognitive delay, schizoaffective disorder presents with suicidal  ideation after episode of self-harm.  Patient is here voluntarily.  Reviewing his chart after initial evaluation notable for recent stay at our behavioral health urgent care center, with pertinent hospital stay notes as below: "19 yo male with schizoaffective disorder, bipolar type and intellectual disability who presented to the ER with law enforcement agter he called 911 and reported that he cut his arm..."   1:11 PM Patient awake, alert.  He has finished his lunch. MDM Rules/Calculators/A&P Adult male schizoaffective disorder, cognitive delay, recent psychiatric hospital evaluation now presents with ongoing suicidal ideation after episode of self-harm. Patient denies physical complaints is hemodynamically unremarkable, was medically cleared for behavioral health evaluation. Final Clinical Impression(s) / ED Diagnoses Final diagnoses:  Suicidal ideation  Self-mutilation  MDM Number of Diagnoses or Management Options Self-mutilation: new, needed workup Suicidal ideation: established, worsening   Amount and/or Complexity of Data Reviewed Clinical lab tests: reviewed Tests in the medicine section of CPT: reviewed Decide to obtain previous medical records or to obtain history from someone other than the patient: yes Obtain history from someone other than the patient: yes Review and summarize past medical records: yes Discuss the patient with other providers: yes  Risk of Complications, Morbidity, and/or Mortality Presenting problems: high Diagnostic procedures: high Management options: high  Critical Care Total time providing critical care: < 30 minutes  Patient Progress Patient progress: stable    Gerhard Munch, MD 11/21/19 1311

## 2019-11-22 MED ORDER — ACETAMINOPHEN 325 MG PO TABS
325.0000 mg | ORAL_TABLET | Freq: Four times a day (QID) | ORAL | Status: DC | PRN
  Administered 2019-11-22 (×2): 325 mg via ORAL
  Filled 2019-11-22 (×2): qty 1

## 2019-11-22 NOTE — Consult Note (Signed)
Telepsych Consultation   Reason for Consult:  Psych consult Referring Physician:  Gerhard Munch, MD Location of Patient: [facility] Location of Provider: [facility]  Patient Identification: [name] [name] MRN:  [id number] Principal Diagnosis: Suicidal behavior Diagnosis:  Principal Problem:   Suicidal behavior Active Problems:   DMDD (disruptive mood dysregulation disorder) (HCC)   Schizoaffective disorder (HCC)   Intellectual disability   Total Time spent with patient: 15 minutes  Subjective:   [name] [name] is a 19 y.o. male patient who presents stating "I woke up holding a knife over my brother. I'm having suicidal and homicidal thoughts". Patient unclear on motive or situation around the incident; patient is labile and inconsistently engaging in assessment.   Patient appears concrete and irritable on assessment, stating "if yall would find me a placement I wouldn't have to keep coming back". Provider inquired about follow-up from most recent discharge and patient responded "they pissed me off, they keep asking me the same questions over and over so I left". Patient sates his mother passed 04/2019 which provoked mental decline. Patient states he's been living with his godbrother and has 5 brothers, 2 sisters. States past mental health history of bipolar, depression, anxiety, and most recently received services via [facility]) but "it's been a while". Patient repeated "Y'all just need to find me a placement instead of sending me home. If y'all are not gonna find me no placement then send me the fuck home!".   Patient has multiple visible superficial horizontal cuts throughout left arm. Mood is labile. Patient is now endorsing suicidal/homicidal ideations. Patient does appear constricted and concrete in his thought processes during assessment. Chart notes patient has history of IDD.   Update: Per EDRN note: 11/22/19 1115 "This nurse witnessed pt hitting himself  in the head post TTS. Nurse questioned patient about why he was hitting himself to which the patient responded that they did not want to go to the placement that was discussed during TTS. This nurse told the patient that she would contact BH on his behalf."  HPI:  Per [facility] Note 11/21/19 2209 "[name] [name] is a 19 year old male who presents voluntary and unaccompanied to [facility]. Clinician asked the pt, "what brought you to the hospital?" Pt reported, when he woke up he was not himself. Pt reported, he was holding a knife standing over his brother, when he came to he left out the house and called the police. Pt reported, he went to the bus stop and threatened to jump in traffic. Pt reported, the police arrived, searched him and took his knife. Pt reported, while in the police car he took his hidden knife, cut his forearm and neck. Pt reported, while in the hospital his hidden knife was taken away. Pt reported, wanting to kill family members by torturing them. Pt reported, hearing different voices telling him to take a police officers' gun and shoot himself and others. Pt reported, he seen dead bodies two, weeks ago."  Collateral: Patient refused to provide name or contact information of anyone to discuss for collateral information or discharge planning.   Past Psychiatric History: Per patient chart: Suicidal behavior, disruptive mood dysregulation disorder, schizoaffective disorder, intellectual disability due to developmental disorder, ADHD, aggressive behavior of adolescent, bipolar I  Risk to Self:  yes Risk to Others:  yes Prior Inpatient Therapy:  yes Prior Outpatient Therapy:  yes  Past Medical History:  Past Medical History:  Diagnosis Date  . ADHD (attention deficit hyperactivity disorder)   .  Aggressive behavior 08/24/2018  . Asthma   . Bipolar 1 disorder (HCC)   . Depression   . Learning disability   . Medical history non-contributory   . Seasonal allergies   . Shock from electroshock  gun (taser) 08/24/2018   Patient was Tased by [employer] at [facility] across street due to aggresive and hostile behavior.   No past surgical history on file. Family History: No family history on file. Family Psychiatric  History: not noted Social History:  Social History   Substance and Sexual Activity  Alcohol Use No     Social History   Substance and Sexual Activity  Drug Use Yes  . Frequency: 3.0 times per week  . Types: Marijuana   Comment: 2-3 blunts at a time    Social History   Socioeconomic History  . Marital status: Single    Spouse name: Not on file  . Number of children: Not on file  . Years of education: Not on file  . Highest education level: Not on file  Occupational History  . Occupation: Unemployed  Tobacco Use  . Smoking status: Current Every Day Smoker    Packs/day: 1.00    Types: Cigarettes  . Smokeless tobacco: Never Used  Vaping Use  . Vaping Use: Never used  Substance and Sexual Activity  . Alcohol use: No  . Drug use: Yes    Frequency: 3.0 times per week    Types: Marijuana    Comment: 2-3 blunts at a time  . Sexual activity: Never  Other Topics Concern  . Not on file  Social History Narrative   Pt stated that he lives with his godfather.  He is unemployed, and he is followed by [employer].   Social Determinants of Health   Financial Resource Strain:   . Difficulty of Paying Living Expenses: Not on file  Food Insecurity:   . Worried About Programme researcher, broadcasting/film/video in the Last Year: Not on file  . Ran Out of Food in the Last Year: Not on file  Transportation Needs:   . Lack of Transportation (Medical): Not on file  . Lack of Transportation (Non-Medical): Not on file  Physical Activity:   . Days of Exercise per Week: Not on file  . Minutes of Exercise per Session: Not on file  Stress:   . Feeling of Stress : Not on file  Social Connections:   . Frequency of Communication with Friends and Family: Not on file  . Frequency of Social Gatherings with  Friends and Family: Not on file  . Attends Religious Services: Not on file  . Active Member of Clubs or Organizations: Not on file  . Attends Banker Meetings: Not on file  . Marital Status: Not on file   Additional Social History:    Allergies:   Allergies  Allergen Reactions  . Haloperidol And Related Anaphylaxis, Swelling and Other (See Comments)    Tongue numb and swollen - possible reaction to haloperidol and/or trazodone 01/08/18  . Trazodone And Nefazodone Anaphylaxis, Swelling and Other (See Comments)    Tongue numb and swollen - possible reaction to haloperidol and/or trazodone 01/08/18    Labs:  Results for orders placed or performed during the hospital encounter of 11/21/19 (from the past 48 hour(s))  Respiratory Panel by RT PCR (Flu A&B, Covid) - Nasopharyngeal Swab     Status: None   Collection Time: 11/21/19  9:46 AM   Specimen: Nasopharyngeal Swab  Result Value Ref Range  SARS Coronavirus 2 by RT PCR NEGATIVE NEGATIVE    Comment: (NOTE) SARS-CoV-2 target nucleic acids are NOT DETECTED.  The SARS-CoV-2 RNA is generally detectable in upper respiratoy specimens during the acute phase of infection. The lowest concentration of SARS-CoV-2 viral copies this assay can detect is 131 copies/mL. A negative result does not preclude SARS-Cov-2 infection and should not be used as the sole basis for treatment or other patient management decisions. A negative result may occur with  improper specimen collection/handling, submission of specimen other than nasopharyngeal swab, presence of viral mutation(s) within the areas targeted by this assay, and inadequate number of viral copies (<131 copies/mL). A negative result must be combined with clinical observations, patient history, and epidemiological information. The expected result is Negative.  Fact Sheet for Patients:  https://www.moore.com/  Fact Sheet for Healthcare Providers:   https://www.young.biz/  This test is no t yet approved or cleared by the [place] FDA and  has been authorized for detection and/or diagnosis of SARS-CoV-2 by FDA under an Emergency Use Authorization (EUA). This EUA will remain  in effect (meaning this test can be used) for the duration of the COVID-19 declaration under Section 564(b)(1) of the Act, 21 U.S.C. section 360bbb-3(b)(1), unless the authorization is terminated or revoked sooner.     Influenza A by PCR NEGATIVE NEGATIVE   Influenza B by PCR NEGATIVE NEGATIVE    Comment: (NOTE) The Xpert Xpress SARS-CoV-2/FLU/RSV assay is intended as an aid in  the diagnosis of influenza from Nasopharyngeal swab specimens and  should not be used as a sole basis for treatment. Nasal washings and  aspirates are unacceptable for Xpert Xpress SARS-CoV-2/FLU/RSV  testing.  Fact Sheet for Patients: https://www.moore.com/  Fact Sheet for Healthcare Providers: https://www.young.biz/  This test is not yet approved or cleared by the [place] FDA and  has been authorized for detection and/or diagnosis of SARS-CoV-2 by  FDA under an Emergency Use Authorization (EUA). This EUA will remain  in effect (meaning this test can be used) for the duration of the  Covid-19 declaration under Section 564(b)(1) of the Act, 21  U.S.C. section 360bbb-3(b)(1), unless the authorization is  terminated or revoked. Performed at [facility], [address], [place], [place] [address]   Comprehensive metabolic panel     Status: None   Collection Time: 11/21/19  9:53 AM  Result Value Ref Range   Sodium 139 135 - 145 mmol/L   Potassium 3.8 3.5 - 5.1 mmol/L   Chloride 103 98 - 111 mmol/L   CO2 26 22 - 32 mmol/L   Glucose, Bld 93 70 - 99 mg/dL    Comment: Glucose reference range applies only to samples taken after fasting for at least 8 hours.   BUN 9 6 - 20 mg/dL   Creatinine, Ser 6.04 0.61  - 1.24 mg/dL   Calcium 9.8 8.9 - 54.0 mg/dL   Total Protein 7.7 6.5 - 8.1 g/dL   Albumin 4.6 3.5 - 5.0 g/dL   AST 27 15 - 41 U/L   ALT 32 0 - 44 U/L   Alkaline Phosphatase 48 38 - 126 U/L   Total Bilirubin 0.7 0.3 - 1.2 mg/dL   GFR, Estimated >98 >11 mL/min    Comment: (NOTE) Calculated using the CKD-EPI Creatinine Equation (2021)    Anion gap 10 5 - 15    Comment: Performed at [facility], [address] [place], [place] [address]  Ethanol     Status: None  Collection Time: 11/21/19  9:53 AM  Result Value Ref Range   Alcohol, Ethyl (B) <10 <10 mg/dL    Comment: (NOTE) Lowest detectable limit for serum alcohol is 10 mg/dL.  For medical purposes only. Performed at [facility], [address] [place], [place] [address]   Salicylate level     Status: Abnormal   Collection Time: 11/21/19  9:53 AM  Result Value Ref Range   Salicylate Lvl <7.0 (L) 7.0 - 30.0 mg/dL    Comment: Performed at [facility], [address] [place], [place] [address]  Acetaminophen level     Status: Abnormal   Collection Time: 11/21/19  9:53 AM  Result Value Ref Range   Acetaminophen (Tylenol), Serum <10 (L) 10 - 30 ug/mL    Comment: (NOTE) Therapeutic concentrations vary significantly. A range of 10-30 ug/mL  may be an effective concentration for many patients. However, some  are best treated at concentrations outside of this range. Acetaminophen concentrations >150 ug/mL at 4 hours after ingestion  and >50 ug/mL at 12 hours after ingestion are often associated with  toxic reactions.  Performed at [facility], [address] [place], [place] [address]   cbc     Status: None   Collection Time: 11/21/19  9:53 AM  Result Value Ref Range   WBC 6.7 4.0 - 10.5 K/uL   RBC 5.37 4.22 - 5.81 MIL/uL   Hemoglobin 16.7 13.0 - 17.0 g/dL   HCT 91.451.0 39 - 52 %   MCV 95.0 80.0 - 100.0 fL   MCH 31.1 26.0 - 34.0 pg   MCHC 32.7 30.0 - 36.0 g/dL   RDW 78.212.4 95.611.5 - 21.315.5 %    Platelets 243 150 - 400 K/uL   nRBC 0.0 0.0 - 0.2 %    Comment: Performed at [facility], [address] [place], [place] [address]  Rapid urine drug screen (hospital performed)     Status: Abnormal   Collection Time: 11/21/19 11:10 AM  Result Value Ref Range   Opiates NONE DETECTED NONE DETECTED   Cocaine NONE DETECTED NONE DETECTED   Benzodiazepines NONE DETECTED NONE DETECTED   Amphetamines NONE DETECTED NONE DETECTED   Tetrahydrocannabinol POSITIVE (A) NONE DETECTED   Barbiturates NONE DETECTED NONE DETECTED    Comment: (NOTE) DRUG SCREEN FOR MEDICAL PURPOSES ONLY.  IF CONFIRMATION IS NEEDED FOR ANY PURPOSE, NOTIFY LAB WITHIN 5 DAYS.  LOWEST DETECTABLE LIMITS FOR URINE DRUG SCREEN Drug Class                     Cutoff (ng/mL) Amphetamine and metabolites    1000 Barbiturate and metabolites    200 Benzodiazepine                 200 Tricyclics and metabolites     300 Opiates and metabolites        300 Cocaine and metabolites        300 THC                            50 Performed at [facility], [address] [place], [place] [address]     Medications:  Current Facility-Administered Medications  Medication Dose Route Frequency Provider Last Rate Last Admin  . LORazepam (ATIVAN) tablet 1 mg  1 mg Oral Q6H PRN Pricilla LovelessGoldston, Scott, MD   1 mg at 11/21/19 2049   Current  Outpatient Medications  Medication Sig Dispense Refill  . [START ON 12/16/2019] ARIPiprazole Lauroxil ER (ARISTADA) 441 MG/1.6ML prefilled syringe Inject 441 mg into the muscle once for 1 dose. 1.5 mL 0  . OXcarbazepine (TRILEPTAL) 150 MG tablet Take 1 tablet (150 mg total) by mouth 2 (two) times daily. (Patient not taking: Reported on 11/21/2019) 60 tablet 0    Musculoskeletal: Strength & Muscle Tone: within normal limits Gait & Station: normal Patient leans: N/A  Psychiatric Specialty Exam: Physical Exam Vitals and nursing note reviewed.  Psychiatric:        Attention and Perception:  Attention and perception normal.        Mood and Affect: Mood is depressed. Affect is labile, blunt and angry.        Behavior: Behavior is agitated and withdrawn.        Thought Content: Thought content includes suicidal ideation.        Judgment: Judgment is impulsive.     Review of Systems  Skin: Positive for wound.       Superficial cuts throughout left forearm  Psychiatric/Behavioral: Positive for agitation, behavioral problems, dysphoric mood, self-injury and sleep disturbance.    Blood pressure 131/78, pulse 61, temperature 98.2 F (36.8 C), temperature source Oral, resp. rate 16, SpO2 99 %.There is no height or weight on file to calculate BMI.  General Appearance: Casual and Guarded  Eye Contact:  Fair  Speech:  Normal Rate  Volume:  Normal  Mood:  Angry, Depressed, Dysphoric and Irritable  Affect:  Blunt, Labile and Restricted  Thought Process:  Goal Directed  Orientation:  Full (Time, Place, and Person)  Thought Content:  WDL and concrete  Suicidal Thoughts:  Yes.  without intent/plan  Homicidal Thoughts:  Yes.  without intent/plan  Memory:  Immediate;   Fair Recent;   Fair Remote;   Fair  Judgement:  Poor  Insight:  Shallow  Psychomotor Activity:  Normal  Concentration:  Concentration: Fair and Attention Span: Fair  Recall:  [employer] of Knowledge:  Poor  Language:  Fair  Akathisia:  NA  Handed:  Right  AIMS (if indicated):     Assets:  Resilience  ADL's:  Intact  Cognition:  WNL and Impaired,  Mild  Sleep:        Treatment Plan Summary: Daily contact with patient to assess and evaluate symptoms and progress in treatment, Medication management and Plan to assess for overnight observation for stabilization. Patient will be reassessed in the morning for disposition.  This service was provided via telemedicine using a 2-way, interactive audio and video technology.  Names of all persons participating in this telemedicine service and their role in this  encounter. Name: Maxie Barb Role: PMHNP  Name: [name] [name] Role: patient  Name:  Role:   Name:  Role:    Loletta Parish, NP 11/22/2019 11:35 AM

## 2019-11-22 NOTE — ED Notes (Signed)
Dinner tray ordered.

## 2019-11-22 NOTE — ED Notes (Signed)
Breakfast Ordered 

## 2019-11-22 NOTE — ED Notes (Signed)
Patient crying and upset stating that his mother passed away this past 09-May-2021and he was not able to say goodbye to her. Was also upset that a brother has not expressed any sadness or grief.  Patient brought to hallway to sit to help relax him.

## 2019-11-22 NOTE — ED Notes (Signed)
This nurse witnessed pt hitting himself in the head post TTS. Nurse questioned patient about why he was hitting himself to which the patient responded that they did not want to go to the placement that was discussed during TTS. This nurse told the patient that she would contact BH on his behalf.

## 2019-11-22 NOTE — ED Notes (Signed)
PT requesting pain med for HA. Nurse to message PA Allena Katz.

## 2019-11-22 NOTE — ED Provider Notes (Addendum)
Emergency Medicine Observation Re-evaluation Note  [name] [name] is a 19 y.o. male, seen on rounds today.  Pt initially presented to the ED for complaints of Suicidal Currently the patient is cooperative, currently speaking on the phone with family member.  TTS recommended inpatient treatment, patient to be referred to another [facility] facility instead of [facility] since there are no beds appropriately available.   Physical Exam  BP 139/80 (BP Location: Right Arm)   Pulse 62   Temp 98.4 F (36.9 C) (Oral)   Resp 16   SpO2 99%  Physical Exam General: Cooperative, currently speaking on phone with family member. Cardiac: Normal rate Lungs: No distress, speaking in clear sentences, no stridor. Psych: Calm and cooperative, on phone.  ED Course / MDM  EKG:    I have reviewed the labs performed to date as well as medications administered while in observation.  Recent changes in the last 24 hours include TTS recommended inpatient services.  Plan  Current plan is for awaiting placement. Patient is not under full IVC at this time.      Farrel Gordon, PA-C 11/22/19 2706    Gwyneth Sprout, MD 11/23/19 [phone]

## 2019-11-23 NOTE — ED Notes (Signed)
Pt's lunch ordered 

## 2019-11-23 NOTE — ED Notes (Signed)
Pt agreed to have vitals reassessed.

## 2019-11-23 NOTE — ED Notes (Signed)
Pt making a phone call. ?

## 2019-11-23 NOTE — ED Notes (Signed)
TTS in use, pt was given his cell phone in this RN's presence to get his Aunt Lori's phone number & give it to who he was speaking with as a contact.

## 2019-11-23 NOTE — ED Notes (Signed)
Pt's lunch arrived 

## 2019-11-23 NOTE — Progress Notes (Signed)
CSW spoke with provider at [facility] regarding APS report due to patient being unable to maintain stability or appropriate care in the community but no longer meeting commitment criteria. CSW contacted DSS and completed APS report.

## 2019-11-23 NOTE — ED Notes (Signed)
Attempted to get vital signs on pt. Pt refused.

## 2019-11-23 NOTE — ED Notes (Signed)
Pt stated to this RN that he has an appointment at [facility] this Tuesday (11/25/19) @ 0800.

## 2019-11-23 NOTE — Consult Note (Signed)
Telepsych Consultation   Reason for Consult:  Psych consult Referring Physician:  Gerhard Munch, MD Location of Patient: [facility] Location of Provider: [facility]  Patient Identification: [name] [name] MRN:  [id number] Principal Diagnosis: Suicidal behavior Diagnosis:  Principal Problem:   Suicidal behavior Active Problems:   DMDD (disruptive mood dysregulation disorder) (HCC)   Schizoaffective disorder (HCC)   Intellectual disability  Total Time spent with patient: 20 minutes  Subjective:   [name] [name] is a 19 y.o. male patient admitted with suicidal behavior and homicidal ideations.  On today's assessment pt is calmer than previous day stating "I'm not having any bad thoughts today, I'm ready to go home". Patient currently denies any suicidal/homicidal ideations, auditory/visual hallucinations, and does not appear to be responding to any internal stimuli. Patient was actively participating in assessment and retrieved aunt's phone number from cell phone and gave permission for provider to discuss care. Patient remains concrete and requires extensive explaining during course of assessment.   Collateral: (m)Aunt [name] [name]: [phone] Per patient's aunt, pt is currently living with 79 year old brother in [place] and "got mad because they asked him to wash dishes". Aunt confirmed her sister did pass away earlier this year placing the patient in care of various family members. Aunt expressed concern if patient is "capable" of caring for himself stating she takes food over to the apartment and body wash because patient does not tend to ADLs appropriately. Aunt says she has recently purchased clothes for patient but they have "gotten lost in shuffle" of patient moving between ED visits. She states patient "gets a shot" every month for his mental health; per [facility] note pt did receive 665mg  Initio, 441mg  Aristada, and 30mg  PO 11/18/2019. Aunt not sure of patient's status with  outpatient services; no documented services noted but did say patient "will not take his medications". Aunt states she is not patient's legal guardian and is currently unable to due to health and having to work full-time x2 jobs. She states patient "does need one because I don't think he's capable". Aunt verbalized feeling "okay" with patient being discharged today and states she did speak to patient today and dropped food off at the apartment earlier.  HPI:  Per [facility] Note 11/21/19 2209 "[name] [name] is a 19 year old male who presents voluntary and unaccompanied to [facility]. Clinician asked the pt, "what brought you to the hospital?" Pt reported, when he woke up he was not himself. Pt reported, he was holding a knife standing over his brother, when he came to he left out the house and called the police. Pt reported, he went to the bus stop and threatened to jump in traffic. Pt reported, the police arrived, searched him and took his knife. Pt reported, while in the police car he took his hidden knife, cut his forearm and neck. Pt reported, while in the hospital his hidden knife was taken away. Pt reported, wanting to kill family members by torturing them. Pt reported, hearing different voices telling him to take a police officers' gun and shoot himself and others. Pt reported, he seen dead bodies two, weeks ago."  Past Psychiatric History: Intellectual Disability, Schizoaffective Disorder  Risk to Self:  pt denies Risk to Others:  pt denies Prior Inpatient Therapy:  yes Prior Outpatient Therapy:  yes  Past Medical History:  Past Medical History:  Diagnosis Date  . ADHD (attention deficit hyperactivity disorder)   . Aggressive behavior 08/24/2018  . Asthma   . Bipolar 1 disorder (HCC)   .  Depression   . Learning disability   . Medical history non-contributory   . Seasonal allergies   . Shock from electroshock gun (taser) 08/24/2018   Patient was [facility] street due to  aggresive and hostile behavior.   No past surgical history on file. Family History: No family history on file. Family Psychiatric  History: not noted Social History:  Social History   Substance and Sexual Activity  Alcohol Use No     Social History   Substance and Sexual Activity  Drug Use Yes  . Frequency: 3.0 times per week  . Types: Marijuana   Comment: 2-3 blunts at a time    Social History   Socioeconomic History  . Marital status: Single    Spouse name: Not on file  . Number of children: Not on file  . Years of education: Not on file  . Highest education level: Not on file  Occupational History  . Occupation: Unemployed  Tobacco Use  . Smoking status: Current Every Day Smoker    Packs/day: 1.00    Types: Cigarettes  . Smokeless tobacco: Never Used  Vaping Use  . Vaping Use: Never used  Substance and Sexual Activity  . Alcohol use: No  . Drug use: Yes    Frequency: 3.0 times per week    Types: Marijuana    Comment: 2-3 blunts at a time  . Sexual activity: Never  Other Topics Concern  . Not on file  Social History Narrative   Pt stated that he lives with his godfather.  He is unemployed, and he is followed by [employer].   Social Determinants of Health   Financial Resource Strain:   . Difficulty of Paying Living Expenses: Not on file  Food Insecurity:   . Worried About Programme researcher, broadcasting/film/video in the Last Year: Not on file  . Ran Out of Food in the Last Year: Not on file  Transportation Needs:   . Lack of Transportation (Medical): Not on file  . Lack of Transportation (Non-Medical): Not on file  Physical Activity:   . Days of Exercise per Week: Not on file  . Minutes of Exercise per Session: Not on file  Stress:   . Feeling of Stress : Not on file  Social Connections:   . Frequency of Communication with Friends and Family: Not on file  . Frequency of Social Gatherings with Friends and Family: Not on file  . Attends Religious Services: Not on file  .  Active Member of Clubs or Organizations: Not on file  . Attends Banker Meetings: Not on file  . Marital Status: Not on file   Additional Social History:    Allergies:   Allergies  Allergen Reactions  . Haloperidol And Related Anaphylaxis, Swelling and Other (See Comments)    Tongue numb and swollen - possible reaction to haloperidol and/or trazodone 01/08/18  . Trazodone And Nefazodone Anaphylaxis, Swelling and Other (See Comments)    Tongue numb and swollen - possible reaction to haloperidol and/or trazodone 01/08/18    Labs: No results found for this or any previous visit (from the past 48 hour(s)).  Medications:  Current Facility-Administered Medications  Medication Dose Route Frequency Provider Last Rate Last Admin  . acetaminophen (TYLENOL) tablet 325 mg  325 mg Oral Q6H PRN Farrel Gordon, PA-C   325 mg at 11/22/19 1811  . LORazepam (ATIVAN) tablet 1 mg  1 mg Oral Q6H PRN Pricilla Loveless, MD  1 mg at 11/23/19 0546   Current Outpatient Medications  Medication Sig Dispense Refill  . [START ON 12/16/2019] ARIPiprazole Lauroxil ER (ARISTADA) 441 MG/1.6ML prefilled syringe Inject 441 mg into the muscle once for 1 dose. 1.5 mL 0  . OXcarbazepine (TRILEPTAL) 150 MG tablet Take 1 tablet (150 mg total) by mouth 2 (two) times daily. (Patient not taking: Reported on 11/21/2019) 60 tablet 0    Musculoskeletal: Strength & Muscle Tone: within normal limits Gait & Station: normal Patient leans: N/A  Psychiatric Specialty Exam: Physical Exam Vitals and nursing note reviewed.  Skin:         Comments: Horizontal cuts in various stages of healing throughout patient's forearm     Review of Systems  Skin:       Various horizontal cuts on inferior and lateral forearm  Psychiatric/Behavioral: Positive for self-injury.  All other systems reviewed and are negative.   Blood pressure (!) 119/57, pulse 69, temperature 98.4 F (36.9 C), temperature source Oral, resp. rate 14,  SpO2 100 %.There is no height or weight on file to calculate BMI.  General Appearance: Casual  Eye Contact:  Fair  Speech:  Normal Rate  Volume:  Normal  Mood:  Euthymic  Affect:  Blunt and Constricted  Thought Process:  Goal Directed  Orientation:  Other:  day of the week, year only  Thought Content:  Tangential and concrete  Suicidal Thoughts:  No  Homicidal Thoughts:  No  Memory:  Immediate;   Fair Recent;   Fair  Judgement:  Poor  Insight:  Shallow  Psychomotor Activity:  Normal  Concentration:  Concentration: Fair and Attention Span: Fair  Recall:  [employer] of Knowledge:  Fair  Language:  Fair; audible speech impediment  Akathisia:  Negative  Handed:  Right  AIMS (if indicated):     Assets:  Desire for Improvement Resilience  ADL's:  Intact  Cognition:  Impaired,  Moderate; history of IDD  Sleep:      Disposition: No evidence of imminent risk to self or others at present.   Patient does not meet criteria for psychiatric inpatient admission. Supportive therapy provided about ongoing stressors. Refer to IOP. Discussed crisis plan, support from social network, calling 911, coming to the Emergency Department, and calling Suicide Hotline. Social consult placed for APS follow-up based on patient IDD history, presentation, and frequent encounters. Patient is currently denying suicidal/homicidal ideations, auditory/visual hallucinations, and does not appear to be responding to any external/internal stimuli and is not actively psychotic at this time. Based on my assessment, patient does not qualify for psychiatric inpatient admission at this time.   Provider consulted with Jola Babinski, MD in [facility]. Social Work consult placed for APS follow-up based on patient IDD history, presentation, and frequent encounters.   This service was provided via telemedicine using a 2-way, interactive audio and video technology.  Names of all persons participating in this telemedicine service and their  role in this encounter. Name: Maxie Barb Role: PMHNP  Name: Nelly Rout Role: Attending MD  Name: [name] Role: patient  Name: Debarah Crape Role: aunt    Loletta Parish, NP 11/23/2019 12:03 PM

## 2019-11-23 NOTE — ED Notes (Signed)
Pt was increasingly getting irritated the longer he was not getting to leave since TTS this morning when he became under the impression that he would be leaving soon. After waiting patiently, he began to escalate emotionally by pacing, asking repeatedly if this RN had heard when he can leave, & say "I'm getting frustrated." After keeping contact with psych department warning them of pt wanting to leave AMA, pt stayed long enough to be seen by social work & eloped right after. Pt was voluntary & cannot be held, this RN gave him his belongings when he demanded them & he left without allowing staff to get V/S before he left.

## 2019-11-23 NOTE — ED Notes (Signed)
Pt getting very anxious & banging the back of his head against the wall.

## 2019-11-24 NOTE — Progress Notes (Signed)
CSW received call from [facility] [phone]) at [facility] regarding this patient. Mick Sell to reach out to patient regarding the recommendation for him to seek services from [place].  Edwin Dada, MSW, LCSW-A Transitions of Care  Clinical Social Worker  [facility] Emergency Departments  Medical ICU [phone]

## 2020-01-28 ENCOUNTER — Emergency Department (HOSPITAL_COMMUNITY)
Admission: EM | Admit: 2020-01-28 | Discharge: 2020-01-28 | Disposition: A | Discharge status: Home / Self Care | Payer: Medicaid Other | Attending: Emergency Medicine | Admitting: Emergency Medicine

## 2020-01-28 ENCOUNTER — Encounter (HOSPITAL_COMMUNITY): Payer: Self-pay | Admitting: Emergency Medicine

## 2020-01-28 ENCOUNTER — Other Ambulatory Visit (HOSPITAL_COMMUNITY): Payer: Self-pay | Admitting: Pediatrics

## 2020-01-28 ENCOUNTER — Other Ambulatory Visit: Payer: Self-pay

## 2020-01-28 DIAGNOSIS — Z5321 Procedure and treatment not carried out due to patient leaving prior to being seen by health care provider: Secondary | ICD-10-CM | POA: Diagnosis not present

## 2020-01-28 DIAGNOSIS — R45851 Suicidal ideations: Secondary | ICD-10-CM | POA: Diagnosis not present

## 2020-01-28 LAB — CBC
HCT: 50 % (ref 39.0–52.0)
Hemoglobin: 17.3 g/dL — ABNORMAL HIGH (ref 13.0–17.0)
MCH: 32.2 pg (ref 26.0–34.0)
MCHC: 34.6 g/dL (ref 30.0–36.0)
MCV: 92.9 fL (ref 80.0–100.0)
Platelets: 255 10*3/uL (ref 150–400)
RBC: 5.38 MIL/uL (ref 4.22–5.81)
RDW: 13.4 % (ref 11.5–15.5)
WBC: 9.4 10*3/uL (ref 4.0–10.5)
nRBC: 0 % (ref 0.0–0.2)

## 2020-01-28 LAB — COMPREHENSIVE METABOLIC PANEL WITH GFR
ALT: 47 U/L — ABNORMAL HIGH (ref 0–44)
AST: 35 U/L (ref 15–41)
Albumin: 4.7 g/dL (ref 3.5–5.0)
Alkaline Phosphatase: 47 U/L (ref 38–126)
Anion gap: 13 (ref 5–15)
BUN: 12 mg/dL (ref 6–20)
CO2: 22 mmol/L (ref 22–32)
Calcium: 9.8 mg/dL (ref 8.9–10.3)
Chloride: 104 mmol/L (ref 98–111)
Creatinine, Ser: 0.83 mg/dL (ref 0.61–1.24)
GFR, Estimated: >60 mL/min
Glucose, Bld: 91 mg/dL (ref 70–99)
Potassium: 3.5 mmol/L (ref 3.5–5.1)
Sodium: 139 mmol/L (ref 135–145)
Total Bilirubin: 0.6 mg/dL (ref 0.3–1.2)
Total Protein: 7.8 g/dL (ref 6.5–8.1)

## 2020-01-28 LAB — ACETAMINOPHEN LEVEL: Acetaminophen (Tylenol), Serum: <10 ug/mL — ABNORMAL LOW (ref 10–30)

## 2020-01-28 LAB — ETHANOL: Alcohol, Ethyl (B): <10 mg/dL

## 2020-01-28 LAB — SALICYLATE LEVEL: Salicylate Lvl: <7 mg/dL — ABNORMAL LOW (ref 7.0–30.0)

## 2020-01-28 MED FILL — SYMBICORT 80-4.5 MCG INH: 80-4.5 | 30 days supply | Qty: 10 | Fill #0

## 2020-01-28 NOTE — ED Provider Notes (Signed)
  Patient arrived to treatment area from lobby but then eloped and was found walking around in the EMS bay.  He was offered to come back to treatment area for evaluation but stated "I can't stay".  He was offered to be evaluated multiple times but decided to leave.  He asked for taxi voucher but advised by RN and security he would need to be evaluated first.  He has decided to leave without evaluation.     Garlon Hatchet, PA-C 01/28/20 0245    Melene Plan, DO 01/28/20 2258

## 2020-01-28 NOTE — ED Notes (Signed)
Patient did not want to be here, continued wanting to leave.  Patient has not been evaluated at this time and does not want to be evaluated.  Patient wanted taxi voucher.  Patient was given the option of either being evaluated, then getting taxi voucher.  Patient did not want to stay to be evaluated.  Patient escorted out of ED.

## 2020-01-28 NOTE — ED Notes (Signed)
Patient returned from outside.

## 2020-01-28 NOTE — ED Triage Notes (Signed)
Pt BIB [employer] voluntarily, states he's feeling suicidal and plans to jump in front of a car.

## 2020-01-28 NOTE — ED Notes (Signed)
Pt unable to be found by staff.

## 2020-02-03 ENCOUNTER — Other Ambulatory Visit (HOSPITAL_COMMUNITY): Payer: Self-pay

## 2020-02-03 MED FILL — ARIPiprazole 30 MG TABS: 30 | 1 days supply | Qty: 1 | Fill #0

## 2020-02-03 MED FILL — MIRTAZAPINE 15 MG TABLET: 15 | 30 days supply | Qty: 30 | Fill #0

## 2020-02-03 MED FILL — ARISTADA INITIO 675 MG/2.4M: 675 | 30 days supply | Qty: 2 | Fill #0

## 2020-02-04 ENCOUNTER — Emergency Department (HOSPITAL_COMMUNITY): Payer: Medicaid Other

## 2020-02-04 ENCOUNTER — Other Ambulatory Visit: Payer: Self-pay

## 2020-02-04 ENCOUNTER — Emergency Department (HOSPITAL_COMMUNITY)
Admission: EM | Admit: 2020-02-04 | Discharge: 2020-02-04 | Disposition: A | Discharge status: Home / Self Care | Payer: Medicaid Other | Attending: Emergency Medicine | Admitting: Emergency Medicine

## 2020-02-04 ENCOUNTER — Encounter (HOSPITAL_COMMUNITY): Payer: Self-pay

## 2020-02-04 DIAGNOSIS — F17210 Nicotine dependence, cigarettes, uncomplicated: Secondary | ICD-10-CM | POA: Insufficient documentation

## 2020-02-04 DIAGNOSIS — R0602 Shortness of breath: Secondary | ICD-10-CM | POA: Diagnosis present

## 2020-02-04 DIAGNOSIS — J45909 Unspecified asthma, uncomplicated: Secondary | ICD-10-CM | POA: Diagnosis not present

## 2020-02-04 DIAGNOSIS — U071 COVID-19: Secondary | ICD-10-CM | POA: Diagnosis not present

## 2020-02-04 DIAGNOSIS — R109 Unspecified abdominal pain: Secondary | ICD-10-CM | POA: Insufficient documentation

## 2020-02-04 LAB — URINALYSIS, ROUTINE W REFLEX MICROSCOPIC
Bacteria, UA: NONE SEEN
Bilirubin Urine: NEGATIVE
Glucose, UA: NEGATIVE mg/dL
Ketones, ur: 80 mg/dL — AB
Leukocytes,Ua: NEGATIVE
Nitrite: NEGATIVE
Protein, ur: 30 mg/dL — AB
Specific Gravity, Urine: 1.032 — ABNORMAL HIGH (ref 1.005–1.030)
pH: 5 (ref 5.0–8.0)

## 2020-02-04 LAB — CBC
HCT: 48.1 % (ref 39.0–52.0)
Hemoglobin: 16.5 g/dL (ref 13.0–17.0)
MCH: 32.1 pg (ref 26.0–34.0)
MCHC: 34.3 g/dL (ref 30.0–36.0)
MCV: 93.6 fL (ref 80.0–100.0)
Platelets: 248 10*3/uL (ref 150–400)
RBC: 5.14 MIL/uL (ref 4.22–5.81)
RDW: 13.1 % (ref 11.5–15.5)
WBC: 6.3 10*3/uL (ref 4.0–10.5)
nRBC: 0 % (ref 0.0–0.2)

## 2020-02-04 LAB — COMPREHENSIVE METABOLIC PANEL
ALT: 29 U/L (ref 0–44)
AST: 24 U/L (ref 15–41)
Albumin: 4.6 g/dL (ref 3.5–5.0)
Alkaline Phosphatase: 49 U/L (ref 38–126)
Anion gap: 12 (ref 5–15)
BUN: 12 mg/dL (ref 6–20)
CO2: 23 mmol/L (ref 22–32)
Calcium: 9.9 mg/dL (ref 8.9–10.3)
Chloride: 102 mmol/L (ref 98–111)
Creatinine, Ser: 1.03 mg/dL (ref 0.61–1.24)
GFR, Estimated: >60 mL/min (ref >60)
Glucose, Bld: 85 mg/dL (ref 70–99)
Potassium: 3.7 mmol/L (ref 3.5–5.1)
Sodium: 137 mmol/L (ref 135–145)
Total Bilirubin: 1.6 mg/dL — ABNORMAL HIGH (ref 0.3–1.2)
Total Protein: 7.3 g/dL (ref 6.5–8.1)

## 2020-02-04 LAB — LIPASE, BLOOD: Lipase: 25 U/L (ref 11–51)

## 2020-02-04 LAB — SARS CORONAVIRUS 2 (TAT 6-24 HRS): SARS Coronavirus 2: POSITIVE — AB

## 2020-02-04 LAB — TROPONIN I (HIGH SENSITIVITY): Troponin I (High Sensitivity): 8 ng/L (ref <18)

## 2020-02-04 MED ORDER — ONDANSETRON 4 MG PO TBDP
4.0000 mg | ORAL_TABLET | Freq: Once | ORAL | Status: AC
  Administered 2020-02-04: 4 mg via ORAL
  Filled 2020-02-04: qty 1

## 2020-02-04 MED ORDER — ONDANSETRON 4 MG PO TBDP: 4.0000 mg | ORAL_TABLET | Freq: Three times a day (TID) | ORAL | 0 refills | Status: DC | PRN

## 2020-02-04 MED ORDER — AEROCHAMBER PLUS FLO-VU MEDIUM MISC
1.0000 | Freq: Once | Status: AC
  Administered 2020-02-04: 1
  Filled 2020-02-04: qty 1

## 2020-02-04 MED ORDER — IBUPROFEN 800 MG PO TABS
800.0000 mg | ORAL_TABLET | Freq: Once | ORAL | Status: AC
  Administered 2020-02-04: 800 mg via ORAL
  Filled 2020-02-04: qty 1

## 2020-02-04 MED ORDER — ALBUTEROL SULFATE HFA 108 (90 BASE) MCG/ACT IN AERS
2.0000 | INHALATION_SPRAY | Freq: Once | RESPIRATORY_TRACT | Status: AC
  Administered 2020-02-04: 2 via RESPIRATORY_TRACT
  Filled 2020-02-04: qty 6.7

## 2020-02-04 NOTE — ED Triage Notes (Signed)
Pt from home with ems c.o abd pain, emesis and loss of taste this morning. States he lives with other people who are sick but no one has had a covid test done.

## 2020-02-04 NOTE — Discharge Instructions (Signed)
You were evaluated today in the emergency department for your fever, chills, body aches.  You have been diagnosed with COVID-19.  Your blood work, chest x-ray, and EKG were very reassuring.  We feel you are safe to go home at this time.  You should isolate yourself for the next 7 days.  At that time you may return to your normal social life as long as you are fever free without Tylenol/ ibuprofen for 24 hours.  You have been prescribed a medication called Zofran which you may take as needed for your nausea. You may continue to take tylenol or ibuprofen as needed for your symptoms.  Return to the emergency department if you develop any worsening difficulty breathing, nausea or vomiting that does not stop, or any other new severe symptoms.
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
## 2020-02-05 ENCOUNTER — Telehealth: Payer: Self-pay | Admitting: *Deleted

## 2020-02-05 NOTE — Telephone Encounter (Signed)
Called to discuss with patient about COVID-19 symptoms and the use of one of the available treatments for those with mild to moderate Covid symptoms and at a high risk of hospitalization.  Pt appears to qualify for outpatient treatment due to co-morbid conditions and/or a member of an at-risk group in accordance with the FDA Emergency Use Authorization.    Symptom onset:  Vaccinated: NoBooster? No Immunocompromised?  Qualifiers:   Unable to reach pt - No working number on the patient's chart.    [name] [name]

## 2020-02-16 ENCOUNTER — Other Ambulatory Visit (HOSPITAL_COMMUNITY): Payer: Self-pay

## 2020-02-16 MED FILL — OLANZapine 10 MG TBDP: 10 | 7 days supply | Qty: 7 | Fill #0

## 2020-02-18 ENCOUNTER — Other Ambulatory Visit (HOSPITAL_COMMUNITY): Payer: Self-pay

## 2020-02-19 MED FILL — CLINDAMYCIN HCL 150 MG CAPS: 150 | 7 days supply | Qty: 42 | Fill #0

## 2020-02-19 MED FILL — IBUPROFEN 600 MG TABLET: 600 | 8 days supply | Qty: 30 | Fill #0

## 2020-03-23 ENCOUNTER — Ambulatory Visit (HOSPITAL_COMMUNITY)
Admission: EM | Admit: 2020-03-23 | Discharge: 2020-03-23 | Disposition: A | Discharge status: Other Acute Inpatient Hospital | Payer: Medicaid Other | Attending: Psychiatry | Admitting: Psychiatry

## 2020-03-23 ENCOUNTER — Other Ambulatory Visit: Payer: Self-pay

## 2020-03-23 DIAGNOSIS — F17210 Nicotine dependence, cigarettes, uncomplicated: Secondary | ICD-10-CM | POA: Insufficient documentation

## 2020-03-23 DIAGNOSIS — Z9114 Patient's other noncompliance with medication regimen: Secondary | ICD-10-CM | POA: Insufficient documentation

## 2020-03-23 DIAGNOSIS — Z634 Disappearance and death of family member: Secondary | ICD-10-CM | POA: Insufficient documentation

## 2020-03-23 DIAGNOSIS — Z630 Problems in relationship with spouse or partner: Secondary | ICD-10-CM | POA: Insufficient documentation

## 2020-03-23 DIAGNOSIS — F3481 Disruptive mood dysregulation disorder: Secondary | ICD-10-CM | POA: Insufficient documentation

## 2020-03-23 DIAGNOSIS — Z20822 Contact with and (suspected) exposure to covid-19: Secondary | ICD-10-CM | POA: Insufficient documentation

## 2020-03-23 DIAGNOSIS — Z79899 Other long term (current) drug therapy: Secondary | ICD-10-CM | POA: Insufficient documentation

## 2020-03-23 DIAGNOSIS — F259 Schizoaffective disorder, unspecified: Secondary | ICD-10-CM | POA: Insufficient documentation

## 2020-03-23 LAB — CBC WITH DIFFERENTIAL/PLATELET
Abs Immature Granulocytes: 0.07 10*3/uL (ref 0.00–0.07)
Basophils Absolute: 0.1 10*3/uL (ref 0.0–0.1)
Basophils Relative: 1 %
Eosinophils Absolute: 0.1 10*3/uL (ref 0.0–0.5)
Eosinophils Relative: 1 %
HCT: 50.4 % (ref 39.0–52.0)
Hemoglobin: 17 g/dL (ref 13.0–17.0)
Immature Granulocytes: 1 %
Lymphocytes Relative: 14 %
Lymphs Abs: 1.4 10*3/uL (ref 0.7–4.0)
MCH: 31.8 pg (ref 26.0–34.0)
MCHC: 33.7 g/dL (ref 30.0–36.0)
MCV: 94.2 fL (ref 80.0–100.0)
Monocytes Absolute: 0.8 10*3/uL (ref 0.1–1.0)
Monocytes Relative: 7 %
Neutro Abs: 7.9 10*3/uL — ABNORMAL HIGH (ref 1.7–7.7)
Neutrophils Relative %: 76 %
Platelets: 239 10*3/uL (ref 150–400)
RBC: 5.35 MIL/uL (ref 4.22–5.81)
RDW: 13.2 % (ref 11.5–15.5)
WBC: 10.3 10*3/uL (ref 4.0–10.5)
nRBC: 0 % (ref 0.0–0.2)

## 2020-03-23 LAB — POCT URINE DRUG SCREEN - MANUAL ENTRY (I-SCREEN)
POC Amphetamine UR: NOT DETECTED
POC Buprenorphine (BUP): NOT DETECTED
POC Cocaine UR: NOT DETECTED
POC Marijuana UR: POSITIVE — AB
POC Methadone UR: NOT DETECTED
POC Methamphetamine UR: NOT DETECTED
POC Morphine: NOT DETECTED
POC Oxazepam (BZO): NOT DETECTED
POC Oxycodone UR: NOT DETECTED
POC Secobarbital (BAR): NOT DETECTED

## 2020-03-23 LAB — COMPREHENSIVE METABOLIC PANEL
ALT: 40 U/L (ref 0–44)
AST: 28 U/L (ref 15–41)
Albumin: 4.5 g/dL (ref 3.5–5.0)
Alkaline Phosphatase: 48 U/L (ref 38–126)
Anion gap: 11 (ref 5–15)
BUN: 10 mg/dL (ref 6–20)
CO2: 25 mmol/L (ref 22–32)
Calcium: 10 mg/dL (ref 8.9–10.3)
Chloride: 103 mmol/L (ref 98–111)
Creatinine, Ser: 0.87 mg/dL (ref 0.61–1.24)
GFR, Estimated: >60 mL/min (ref >60)
Glucose, Bld: 81 mg/dL (ref 70–99)
Potassium: 4.1 mmol/L (ref 3.5–5.1)
Sodium: 139 mmol/L (ref 135–145)
Total Bilirubin: 1 mg/dL (ref 0.3–1.2)
Total Protein: 7.8 g/dL (ref 6.5–8.1)

## 2020-03-23 LAB — POC SARS CORONAVIRUS 2 AG -  ED: SARS Coronavirus 2 Ag: NEGATIVE

## 2020-03-23 LAB — ETHANOL: Alcohol, Ethyl (B): <10 mg/dL (ref <10)

## 2020-03-23 LAB — POC SARS CORONAVIRUS 2 AG: SARS Coronavirus 2 Ag: NEGATIVE

## 2020-03-23 MED ORDER — ACETAMINOPHEN 325 MG PO TABS: 650.0000 mg | ORAL_TABLET | Freq: Four times a day (QID) | ORAL | Status: DC | PRN

## 2020-03-23 MED ORDER — ARIPIPRAZOLE 15 MG PO TABS
30.0000 mg | ORAL_TABLET | Freq: Once | ORAL | Status: AC
  Administered 2020-03-23: 30 mg via ORAL
  Filled 2020-03-23: qty 2

## 2020-03-23 MED ORDER — ARIPIPRAZOLE ER 400 MG IM SRER
400.0000 mg | Freq: Once | INTRAMUSCULAR | Status: AC
  Administered 2020-03-23: 400 mg via INTRAMUSCULAR

## 2020-03-23 MED ORDER — HYDROXYZINE HCL 25 MG PO TABS
25.0000 mg | ORAL_TABLET | Freq: Three times a day (TID) | ORAL | Status: DC | PRN
Start: 2020-03-23 — End: 2020-03-23

## 2020-03-23 MED ORDER — ARIPIPRAZOLE 15 MG PO TABS: 15.0000 mg | ORAL_TABLET | Freq: Every day | ORAL | Status: DC

## 2020-03-23 MED ORDER — MAGNESIUM HYDROXIDE 400 MG/5ML PO SUSP: 30.0000 mL | Freq: Every day | ORAL | Status: DC | PRN

## 2020-03-23 MED ORDER — ALUM & MAG HYDROXIDE-SIMETH 200-200-20 MG/5ML PO SUSP: 30.0000 mL | ORAL | Status: DC | PRN

## 2020-03-23 MED ORDER — ARIPIPRAZOLE 15 MG PO TABS: 15.0000 mg | ORAL_TABLET | Freq: Every day | ORAL | 0 refills | Status: DC

## 2020-03-23 NOTE — Discharge Instructions (Addendum)
Transfer to [facility]

## 2020-03-23 NOTE — ED Notes (Signed)
Report called to Angie, RN

## 2020-03-23 NOTE — Progress Notes (Signed)
Patient information was sent to [facility] via secure chat to review for potential admission. Patient meets inpatient criteria per Earlene Plater, MD . Per [facility], patient does not meet criteria for admission due to dx of IDD. Disposition CSW will refer patient out to other facilities.   Situation ongoing, CSW will continue to monitor progress.    Signed:  Corky Crafts, MSW, [place], LCASA 03/23/2020 10:41 AM

## 2020-03-23 NOTE — Progress Notes (Addendum)
Pt accepted to [facility].  Patient meets inpatient criteria per Earlene Plater, MD   Dr. Tonna Corner is the attending provider.    Call report to [phone] @ 1600.   Matt Loflin, LPN @ [facility] notified in person.     Pt is Voluntary.    Pt may be transported by [employer].    Pt scheduled  to [facility] at [facility] on the 5th [facility].   The facility requests that transportation be called AFTER calling report.    Signed:  Corky Crafts, MSW, LCSWA, LCASA 03/23/2020 1:28 PM

## 2020-03-23 NOTE — Progress Notes (Addendum)
ADDENDUM CSW received call from [facility] admissions. Patient is under review, facility requested CBC, CMP, and alcohol results once completed. CSW provided requested labs and will continue to monitor.   Signed:  Corky Crafts, MSW, [place], LCASA 03/23/2020 12:21 PM   Per [facility], patient meets exclusionary criteria for admissions due to dx of IDD. Pt. meets criteria for inpatient treatment per Earlene Plater, MD . Referred out to the following hospitals:   Destination  Service Provider Address Phone Fax  [facility]  [address] [place] [address] [phone]  [facility]  [address] [place] [address] [phone]  [facility] [address]  [address], [place] [address] [phone]  [facility]  [address] [place] [address] [phone]  [facility]  [address] [place], [place] [address] [phone]  [facility]  [address] [place] [address] [phone]  [facility]  [address] [place], [place] [address] [phone]  [facility]  [address] [place] [address] [phone]  [facility]  [address] [place] [address] [phone]  [facility]  [address], [place] [address] [phone]  [facility]  [address], [place] [address] [phone]  [facility]  [address] [place] [address] [phone]  [facility]  [address] [place] [address] [phone]  [facility]  [address] [place] [address] [phone]  [facility]  [address], [place] [address] [phone]     Disposition CSW will continue to follow for placement.  Signed:  Corky Crafts, MSW, [place], LCASA 03/23/2020 10:47 AM
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
## 2020-03-23 NOTE — BH Assessment (Addendum)
TTS Triage: Pt to [facility] via [employer] with SI and plan to hang himself or cut his throat after a break up with his girlfriend. Pt also having HI towards family members. Pt reports THC use 2 days ago, has history of cutting and reports command auditory hallucinations of voices telling him to kill himself and hurt others. Pt reports that his mom died 05-28-2019.   Pt is emergent.

## 2020-03-23 NOTE — ED Provider Notes (Signed)
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 [address]  . Opiates 11/17/2019 NONE DETECTED  NONE DETECTED Final  . Cocaine 11/17/2019 NONE DETECTED  NONE DETECTED Final  . Benzodiazepines 11/17/2019 NONE DETECTED  NONE DETECTED Final  . Amphetamines 11/17/2019 NONE DETECTED  NONE DETECTED Final  . Tetrahydrocannabinol 11/17/2019 POSITIVE* NONE DETECTED Final  . Barbiturates 11/17/2019 NONE DETECTED  NONE DETECTED Final   Comment: (NOTE) DRUG SCREEN FOR MEDICAL PURPOSES ONLY.  IF  CONFIRMATION IS NEEDED FOR ANY PURPOSE, NOTIFY LAB WITHIN 5 DAYS.  LOWEST DETECTABLE LIMITS FOR URINE DRUG SCREEN Drug Class                     Cutoff (ng/mL) Amphetamine and metabolites    1000 Barbiturate and metabolites    200 Benzodiazepine                 200 Tricyclics and metabolites     300 Opiates and metabolites        300 Cocaine and metabolites        300 THC  50 Performed at [facility], [address] [place], [place] [address]   . SARS Coronavirus 2 by RT PCR 11/17/2019 NEGATIVE  NEGATIVE Final   Comment: (NOTE) SARS-CoV-2 target nucleic acids are NOT DETECTED.  The SARS-CoV-2 RNA is generally detectable in upper respiratoy specimens during the acute phase of infection. The lowest concentration of SARS-CoV-2 viral copies this assay can detect is 131 copies/mL. A negative result does not preclude SARS-Cov-2 infection and should not be used as the sole basis for treatment or other patient management decisions. A negative result may occur with  improper specimen collection/handling, submission of specimen other than nasopharyngeal swab, presence of viral mutation(s) within the areas targeted by this assay, and inadequate number of viral copies (<131 copies/mL). A negative result must be combined with clinical observations, patient history, and epidemiological information. The expected result is Negative.  Fact Sheet for Patients:  https://www.moore.com/  Fact Sheet for Healthcare Providers:  https://www.young.biz/  This test is no                          t yet approved or cleared by the [place] FDA and  has been authorized for detection and/or diagnosis of SARS-CoV-2 by FDA under an Emergency Use Authorization (EUA). This EUA will remain  in effect (meaning this test can be used) for the duration of the COVID-19 declaration under Section 564(b)(1) of the Act, 21 U.S.C. section  360bbb-3(b)(1), unless the authorization is terminated or revoked sooner.    . Influenza A by PCR 11/17/2019 NEGATIVE  NEGATIVE Final  . Influenza B by PCR 11/17/2019 NEGATIVE  NEGATIVE Final   Comment: (NOTE) The Xpert Xpress SARS-CoV-2/FLU/RSV assay is intended as an aid in  the diagnosis of influenza from Nasopharyngeal swab specimens and  should not be used as a sole basis for treatment. Nasal washings and  aspirates are unacceptable for Xpert Xpress SARS-CoV-2/FLU/RSV  testing.  Fact Sheet for Patients: https://www.moore.com/  Fact Sheet for Healthcare Providers: https://www.young.biz/  This test is not yet approved or cleared by the [place] FDA and  has been authorized for detection and/or diagnosis of SARS-CoV-2 by  FDA under an Emergency Use Authorization (EUA). This EUA will remain  in effect (meaning this test can be used) for the duration of the  Covid-19 declaration under Section 564(b)(1) of the Act, 21  U.S.C. section 360bbb-3(b)(1), unless the authorization is  terminated or revoked. Performed at [facility], [address] [place], [place] [address]   Admission on 10/14/2019, Discharged on 10/15/2019  Component Date Value Ref Range Status  . Sodium 10/14/2019 141  135 - 145 mmol/L Final  . Potassium 10/14/2019 3.6  3.5 - 5.1 mmol/L Final  . Chloride 10/14/2019 105  98 - 111 mmol/L Final  . CO2 10/14/2019 25  22 - 32 mmol/L Final  . Glucose, Bld 10/14/2019 84  70 - 99 mg/dL Final   Glucose reference range applies only to samples taken after fasting for at least 8 hours.  . BUN 10/14/2019 6  6 - 20 mg/dL Final  . Creatinine, Ser 10/14/2019 0.95  0.61 - 1.24 mg/dL Final  . Calcium 09/81/1914 9.9  8.9 - 10.3 mg/dL Final  . Total Protein 10/14/2019 8.0  6.5 - 8.1 g/dL Final  . Albumin 78/29/5621 5.0  3.5 - 5.0 g/dL Final  . AST 30/86/5784 40  15 - 41 U/L Final  . ALT 10/14/2019 37  0 - 44  U/L Final  . Alkaline  Phosphatase 10/14/2019 49  38 - 126 U/L Final  . Total Bilirubin 10/14/2019 0.9  0.3 - 1.2 mg/dL Final  . GFR calc non Af Amer 10/14/2019 >60  >60 mL/min Final  . GFR calc Af Amer 10/14/2019 >60  >60 mL/min Final  . Anion gap 10/14/2019 11  5 - 15 Final   Performed at [facility], [address], [place], [place] [address]  . Alcohol, Ethyl (B) 10/14/2019 <10  <10 mg/dL Final   Comment: (NOTE) Lowest detectable limit for serum alcohol is 10 mg/dL.  For medical purposes only. Performed at [facility], [address] [place], [place] [address]   . Salicylate Lvl 10/14/2019 <7.0* 7.0 - 30.0 mg/dL Final   Performed at [facility], [address], [place], [place] [address]  . Acetaminophen (Tylenol), Serum 10/14/2019 <10* 10 - 30 ug/mL Final   Comment: (NOTE) Therapeutic concentrations vary significantly. A range of 10-30 ug/mL  may be an effective concentration for many patients. However, some  are best treated at concentrations outside of this range. Acetaminophen concentrations >150 ug/mL at 4 hours after ingestion  and >50 ug/mL at 12 hours after ingestion are often associated with  toxic reactions.  Performed at [facility], [address], [place], [place] [address]   . WBC 10/14/2019 9.0  4.0 - 10.5 K/uL Final  . RBC 10/14/2019 5.22  4.22 - 5.81 MIL/uL Final  . Hemoglobin 10/14/2019 16.5  13.0 - 17.0 g/dL Final  . HCT 29/56/2130 49.5  39.0 - 52.0 % Final  . MCV 10/14/2019 94.8  80.0 - 100.0 fL Final  . MCH 10/14/2019 31.6  26.0 - 34.0 pg Final  . MCHC 10/14/2019 33.3  30.0 - 36.0 g/dL Final  . RDW 86/57/8469 12.9  11.5 - 15.5 % Final  . Platelets 10/14/2019 288  150 - 400 K/uL Final  . nRBC 10/14/2019 0.0  0.0 - 0.2 % Final   Performed at [facility], [address] [place], [place] [address]  . SARS Coronavirus 2 by RT PCR 10/15/2019 NEGATIVE  NEGATIVE Final   Comment: (NOTE) SARS-CoV-2 target nucleic acids are NOT DETECTED.  The  SARS-CoV-2 RNA is generally detectable in upper respiratoy specimens during the acute phase of infection. The lowest concentration of SARS-CoV-2 viral copies this assay can detect is 131 copies/mL. A negative result does not preclude SARS-Cov-2 infection and should not be used as the sole basis for treatment or other patient management decisions. A negative result may occur with  improper specimen collection/handling, submission of specimen other than nasopharyngeal swab, presence of viral mutation(s) within the areas targeted by this assay, and inadequate number of viral copies (<131 copies/mL). A negative result must be combined with clinical observations, patient history, and epidemiological information. The expected result is Negative.  Fact Sheet for Patients:  https://www.moore.com/  Fact Sheet for Healthcare Providers:  https://www.young.biz/  This test is no                          t yet approved or cleared by the [place] FDA and  has been authorized for detection and/or diagnosis of SARS-CoV-2 by FDA under an Emergency Use Authorization (EUA). This EUA will remain  in effect (meaning this test can be used) for the duration of the COVID-19 declaration under Section 564(b)(1) of the Act, 21 U.S.C. section 360bbb-3(b)(1), unless the authorization  is terminated or revoked sooner.    . Influenza A by PCR 10/15/2019 NEGATIVE  NEGATIVE Final  . Influenza B by PCR 10/15/2019 NEGATIVE  NEGATIVE Final   Comment: (NOTE) The Xpert Xpress SARS-CoV-2/FLU/RSV assay is intended as an aid in  the diagnosis of influenza from Nasopharyngeal swab specimens and  should not be used as a sole basis for treatment. Nasal washings and  aspirates are unacceptable for Xpert Xpress SARS-CoV-2/FLU/RSV  testing.  Fact Sheet for Patients: https://www.moore.com/  Fact Sheet for Healthcare  Providers: https://www.young.biz/  This test is not yet approved or cleared by the [place] FDA and  has been authorized for detection and/or diagnosis of SARS-CoV-2 by  FDA under an Emergency Use Authorization (EUA). This EUA will remain  in effect (meaning this test can be used) for the duration of the  Covid-19 declaration under Section 564(b)(1) of the Act, 21  U.S.C. section 360bbb-3(b)(1), unless the authorization is  terminated or revoked. Performed at [facility], [address] [place], [place] [address]     Allergies: Haloperidol and related and Trazodone and nefazodone  PTA Medications: (Not in a hospital admission)   Medical Decision Making  Patient currently reporting SI with a plan as well as HI with a plan that he declines to discuss. Patient also reported psychotic sx (AH) in the setting of medication non compliance. Patient meets criteria for inpatient admission and will seek placement.  Routine Labs ordered- CBC, CMP,  ethanol, UDS,EKG.  -lipid panel with elevated LDL (07/2019), TSH 1.214 (wnl on 07/2019) , a1c 5.2 (07/2019)   -restart abilify 30 mg qdaily for psychosis -will restart abilify maintena LAI; will need 14 days of oral overlap     Recommendations  Based on my evaluation the patient does not appear to have an emergency medical condition.  Estella Husk, MD 03/23/20  10:00 AM
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
## 2020-03-23 NOTE — ED Notes (Signed)
Pt ambulated per self on unit. A&O x 4. No s/s pain, discomfort, or acute distress. Will contract for safety on the unit. Endorsing passive SI without plan at the moment. No HI or AVH. Oriented to staff and unit. Will continue to monitor for safety

## 2020-03-24 ENCOUNTER — Telehealth (HOSPITAL_COMMUNITY): Payer: Self-pay | Admitting: Pediatrics

## 2020-03-24 NOTE — Telephone Encounter (Signed)
Care Management - Follow Up [facility] Discharges   Patient has been accepted to an inpatient psychiatric hosplital [facility]).

## 2020-05-06 ENCOUNTER — Encounter (HOSPITAL_COMMUNITY): Payer: Self-pay

## 2020-05-06 ENCOUNTER — Other Ambulatory Visit: Payer: Self-pay

## 2020-05-06 ENCOUNTER — Emergency Department (HOSPITAL_COMMUNITY)
Admission: EM | Admit: 2020-05-06 | Discharge: 2020-05-07 | Disposition: A | Discharge status: Home / Self Care | Payer: Medicaid Other | Attending: Emergency Medicine | Admitting: Emergency Medicine

## 2020-05-06 DIAGNOSIS — R440 Auditory hallucinations: Secondary | ICD-10-CM | POA: Insufficient documentation

## 2020-05-06 DIAGNOSIS — F250 Schizoaffective disorder, bipolar type: Secondary | ICD-10-CM | POA: Diagnosis present

## 2020-05-06 DIAGNOSIS — R45851 Suicidal ideations: Secondary | ICD-10-CM | POA: Diagnosis not present

## 2020-05-06 DIAGNOSIS — F329 Major depressive disorder, single episode, unspecified: Secondary | ICD-10-CM | POA: Insufficient documentation

## 2020-05-06 DIAGNOSIS — F17210 Nicotine dependence, cigarettes, uncomplicated: Secondary | ICD-10-CM | POA: Diagnosis not present

## 2020-05-06 DIAGNOSIS — J45909 Unspecified asthma, uncomplicated: Secondary | ICD-10-CM | POA: Insufficient documentation

## 2020-05-06 DIAGNOSIS — Z20822 Contact with and (suspected) exposure to covid-19: Secondary | ICD-10-CM | POA: Diagnosis not present

## 2020-05-06 DIAGNOSIS — F29 Unspecified psychosis not due to a substance or known physiological condition: Secondary | ICD-10-CM

## 2020-05-06 LAB — CBC WITH DIFFERENTIAL/PLATELET
Abs Immature Granulocytes: 0.08 K/uL — ABNORMAL HIGH (ref 0.00–0.07)
Basophils Absolute: 0.1 K/uL (ref 0.0–0.1)
Basophils Relative: 1 %
Eosinophils Absolute: 0 K/uL (ref 0.0–0.5)
Eosinophils Relative: 0 %
HCT: 53.6 % — ABNORMAL HIGH (ref 39.0–52.0)
Hemoglobin: 17.9 g/dL — ABNORMAL HIGH (ref 13.0–17.0)
Immature Granulocytes: 1 %
Lymphocytes Relative: 15 %
Lymphs Abs: 1.4 K/uL (ref 0.7–4.0)
MCH: 31.7 pg (ref 26.0–34.0)
MCHC: 33.4 g/dL (ref 30.0–36.0)
MCV: 94.9 fL (ref 80.0–100.0)
Monocytes Absolute: 0.9 K/uL (ref 0.1–1.0)
Monocytes Relative: 9 %
Neutro Abs: 7 K/uL (ref 1.7–7.7)
Neutrophils Relative %: 74 %
Platelets: 295 K/uL (ref 150–400)
RBC: 5.65 MIL/uL (ref 4.22–5.81)
RDW: 13.2 % (ref 11.5–15.5)
WBC: 9.5 K/uL (ref 4.0–10.5)
nRBC: 0 % (ref 0.0–0.2)

## 2020-05-06 LAB — RESP PANEL BY RT-PCR (FLU A&B, COVID) ARPGX2
Influenza A by PCR: NEGATIVE
Influenza B by PCR: NEGATIVE
SARS Coronavirus 2 by RT PCR: NEGATIVE

## 2020-05-06 LAB — COMPREHENSIVE METABOLIC PANEL WITH GFR
ALT: 57 U/L — ABNORMAL HIGH (ref 0–44)
AST: 52 U/L — ABNORMAL HIGH (ref 15–41)
Albumin: 4.9 g/dL (ref 3.5–5.0)
Alkaline Phosphatase: 57 U/L (ref 38–126)
Anion gap: 9 (ref 5–15)
BUN: 17 mg/dL (ref 6–20)
CO2: 28 mmol/L (ref 22–32)
Calcium: 10.1 mg/dL (ref 8.9–10.3)
Chloride: 103 mmol/L (ref 98–111)
Creatinine, Ser: 1.05 mg/dL (ref 0.61–1.24)
GFR, Estimated: >60 mL/min
Glucose, Bld: 78 mg/dL (ref 70–99)
Potassium: 3.9 mmol/L (ref 3.5–5.1)
Sodium: 140 mmol/L (ref 135–145)
Total Bilirubin: 1.1 mg/dL (ref 0.3–1.2)
Total Protein: 8.7 g/dL — ABNORMAL HIGH (ref 6.5–8.1)

## 2020-05-06 LAB — RAPID URINE DRUG SCREEN, HOSP PERFORMED
Amphetamines: NOT DETECTED
Barbiturates: NOT DETECTED
Benzodiazepines: NOT DETECTED
Cocaine: NOT DETECTED
Opiates: NOT DETECTED
Tetrahydrocannabinol: POSITIVE — AB

## 2020-05-06 LAB — ETHANOL: Alcohol, Ethyl (B): <10 mg/dL (ref <10)

## 2020-05-06 MED ORDER — MOMETASONE FURO-FORMOTEROL FUM 100-5 MCG/ACT IN AERO
2.0000 | INHALATION_SPRAY | Freq: Two times a day (BID) | RESPIRATORY_TRACT | Status: DC
  Filled 2020-05-06: qty 8.8

## 2020-05-06 MED ORDER — ARIPIPRAZOLE 5 MG PO TABS
15.0000 mg | ORAL_TABLET | Freq: Every day | ORAL | Status: DC
  Administered 2020-05-07: 15 mg via ORAL
  Filled 2020-05-06: qty 1

## 2020-05-06 MED ORDER — MIRTAZAPINE 15 MG PO TABS
15.0000 mg | ORAL_TABLET | Freq: Every day | ORAL | Status: DC
  Administered 2020-05-06: 15 mg via ORAL
  Filled 2020-05-06: qty 1

## 2020-05-06 NOTE — BH Assessment (Addendum)
Clinician attempted to make contact w/ the Charge Nurse ([phone]) and the Secretary ([phone]) in an effort to complete pt's MH Assessment, but there was no answer on either line. Clinician was unable to contact anyone via internal secure chat (no one has logged on as a provider for pt). TTS will attempt assessment at a later time.
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
## 2020-05-06 NOTE — ED Triage Notes (Signed)
Emergency Medicine Provider Triage Evaluation Note  [name] [name] , a 20 y.o. male  was evaluated in triage.  Pt complains of SI, HI. Plan to hang self, hearing voices telling him to harm himself and others. No specific plan but would harm his family. Admits to marijuana use, no other drug or alcohol use.  Review of Systems  Positive: SI, HI Negative: Current medical complaint  Physical Exam  There were no vitals taken for this visit. Gen:   Awake, no distress   HEENT:  Atraumatic  Resp:  Normal effort  Cardiac:  Normal rate  Abd:   Nondistended, nontender  MSK:   Moves extremities without difficulty  Neuro:  Speech clear   Medical Decision Making  Medically screening exam initiated at 5:16 PM.  Appropriate orders placed.  [name] [name] was informed that the remainder of the evaluation will be completed by another provider, this initial triage assessment does not replace that evaluation, and the importance of remaining in the ED until their evaluation is complete.  Clinical Impression     [name] [name] 05/06/20 1720

## 2020-05-06 NOTE — ED Triage Notes (Signed)
Pt states he has suicidal thoughts and was going to hang himself today. States he had a rope around his neck but his brother stopped him. Pt states he has homicidal thoughts also.

## 2020-05-07 DIAGNOSIS — F250 Schizoaffective disorder, bipolar type: Secondary | ICD-10-CM

## 2020-05-07 MED ORDER — ARIPIPRAZOLE 10 MG PO TABS: 10.0000 mg | ORAL_TABLET | Freq: Every day | ORAL | 0 refills | Status: DC

## 2020-05-07 MED ORDER — ARIPIPRAZOLE ER 400 MG IM SRER
400.0000 mg | INTRAMUSCULAR | Status: DC
  Administered 2020-05-07: 400 mg via INTRAMUSCULAR
  Filled 2020-05-07 (×2): qty 2

## 2020-05-07 MED ORDER — LORAZEPAM 1 MG PO TABS
1.0000 mg | ORAL_TABLET | Freq: Once | ORAL | Status: AC
  Administered 2020-05-07: 1 mg via ORAL
  Filled 2020-05-07: qty 1
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
## 2020-05-07 NOTE — Discharge Instructions (Addendum)
To take your Abilify at bedtime every night.  Follow-up with behavioral health as planned.

## 2020-05-07 NOTE — ED Notes (Signed)
TTS eval in progress  °

## 2020-05-07 NOTE — ED Notes (Signed)
Breakfast tray ordered 

## 2020-05-07 NOTE — ED Notes (Signed)
Pt sleeping. Respirations even and unlabored. No distress noted, will do vitals when pt wakes up.

## 2020-05-07 NOTE — ED Notes (Signed)
Pt escorted to shower with toiletries and lines. Sitter remains with patient

## 2020-05-07 NOTE — BH Assessment (Addendum)
Per chart, patient has a legal guardian named Sheran Lawless.  No number on file.  Pt says he does not have a guardian.    Pt will stay at [facility] until being seen by psychiatry.  Pt acuity in the past is reason.  Nurse said patient was asleep and has been cooperative with him.

## 2020-05-07 NOTE — ED Provider Notes (Signed)
Patient has been evaluated by behavioral health this morning.  He is in no acute distress.  He is intermittently standing and roaming and took a shower but is easily redirectable.  They recommended a long-acting injectable which she received in the emergency room but then he can be discharged with Abilify nightly.  He does have follow-up.  Patient is comfortable with this plan.   Gwyneth Sprout, MD 05/07/20 1130

## 2020-06-11 ENCOUNTER — Other Ambulatory Visit: Payer: Self-pay

## 2020-06-11 ENCOUNTER — Ambulatory Visit (HOSPITAL_COMMUNITY): Payer: Medicaid Other | Admitting: Psychiatry

## 2020-08-14 ENCOUNTER — Emergency Department (HOSPITAL_COMMUNITY): Admission: EM | Admit: 2020-08-14 | Discharge: 2020-08-14 | Discharge status: Against Medical Advice | Payer: Self-pay

## 2020-08-14 NOTE — ED Notes (Signed)
Patient decided to leave patient asked for a bus pass

## 2020-12-14 ENCOUNTER — Other Ambulatory Visit: Payer: Self-pay

## 2020-12-14 ENCOUNTER — Ambulatory Visit (HOSPITAL_COMMUNITY)
Admission: EM | Admit: 2020-12-14 | Discharge: 2020-12-14 | Disposition: A | Discharge status: Home / Self Care | Payer: Medicaid Other | Attending: Nurse Practitioner | Admitting: Nurse Practitioner

## 2020-12-14 DIAGNOSIS — F419 Anxiety disorder, unspecified: Secondary | ICD-10-CM | POA: Insufficient documentation

## 2020-12-14 DIAGNOSIS — Z20822 Contact with and (suspected) exposure to covid-19: Secondary | ICD-10-CM | POA: Insufficient documentation

## 2020-12-14 DIAGNOSIS — F1290 Cannabis use, unspecified, uncomplicated: Secondary | ICD-10-CM | POA: Insufficient documentation

## 2020-12-14 DIAGNOSIS — Z560 Unemployment, unspecified: Secondary | ICD-10-CM | POA: Insufficient documentation

## 2020-12-14 DIAGNOSIS — F250 Schizoaffective disorder, bipolar type: Secondary | ICD-10-CM | POA: Diagnosis not present

## 2020-12-14 DIAGNOSIS — F17210 Nicotine dependence, cigarettes, uncomplicated: Secondary | ICD-10-CM | POA: Insufficient documentation

## 2020-12-14 LAB — CBC WITH DIFFERENTIAL/PLATELET
Abs Immature Granulocytes: 0.05 10*3/uL (ref 0.00–0.07)
Basophils Absolute: 0.1 10*3/uL (ref 0.0–0.1)
Basophils Relative: 1 %
Eosinophils Absolute: 0.1 10*3/uL (ref 0.0–0.5)
Eosinophils Relative: 1 %
HCT: 46.8 % (ref 39.0–52.0)
Hemoglobin: 16 g/dL (ref 13.0–17.0)
Immature Granulocytes: 1 %
Lymphocytes Relative: 22 %
Lymphs Abs: 1.7 10*3/uL (ref 0.7–4.0)
MCH: 31.6 pg (ref 26.0–34.0)
MCHC: 34.2 g/dL (ref 30.0–36.0)
MCV: 92.5 fL (ref 80.0–100.0)
Monocytes Absolute: 0.7 10*3/uL (ref 0.1–1.0)
Monocytes Relative: 9 %
Neutro Abs: 5.4 10*3/uL (ref 1.7–7.7)
Neutrophils Relative %: 66 %
Platelets: 222 10*3/uL (ref 150–400)
RBC: 5.06 MIL/uL (ref 4.22–5.81)
RDW: 12.6 % (ref 11.5–15.5)
WBC: 8 10*3/uL (ref 4.0–10.5)
nRBC: 0 % (ref 0.0–0.2)

## 2020-12-14 LAB — COMPREHENSIVE METABOLIC PANEL
ALT: 21 U/L (ref 0–44)
AST: 25 U/L (ref 15–41)
Albumin: 4.4 g/dL (ref 3.5–5.0)
Alkaline Phosphatase: 50 U/L (ref 38–126)
Anion gap: 8 (ref 5–15)
BUN: 9 mg/dL (ref 6–20)
CO2: 26 mmol/L (ref 22–32)
Calcium: 9.9 mg/dL (ref 8.9–10.3)
Chloride: 105 mmol/L (ref 98–111)
Creatinine, Ser: 0.89 mg/dL (ref 0.61–1.24)
GFR, Estimated: >60 mL/min (ref >60)
Glucose, Bld: 87 mg/dL (ref 70–99)
Potassium: 3.6 mmol/L (ref 3.5–5.1)
Sodium: 139 mmol/L (ref 135–145)
Total Bilirubin: 0.5 mg/dL (ref 0.3–1.2)
Total Protein: 7.4 g/dL (ref 6.5–8.1)

## 2020-12-14 LAB — LIPID PANEL
Cholesterol: 170 mg/dL (ref 0–200)
HDL: 50 mg/dL (ref >40)
LDL Cholesterol: 113 mg/dL — ABNORMAL HIGH (ref 0–99)
Total CHOL/HDL Ratio: 3.4 RATIO
Triglycerides: 37 mg/dL (ref <150)
VLDL: 7 mg/dL (ref 0–40)

## 2020-12-14 LAB — POCT URINE DRUG SCREEN - MANUAL ENTRY (I-SCREEN)
POC Amphetamine UR: NOT DETECTED
POC Buprenorphine (BUP): NOT DETECTED
POC Cocaine UR: NOT DETECTED
POC Marijuana UR: POSITIVE — AB
POC Methadone UR: NOT DETECTED
POC Methamphetamine UR: NOT DETECTED
POC Morphine: NOT DETECTED
POC Oxazepam (BZO): NOT DETECTED
POC Oxycodone UR: NOT DETECTED
POC Secobarbital (BAR): NOT DETECTED

## 2020-12-14 LAB — HEMOGLOBIN A1C
Hgb A1c MFr Bld: 5.2 % (ref 4.8–5.6)
Mean Plasma Glucose: 103 mg/dL

## 2020-12-14 LAB — ETHANOL: Alcohol, Ethyl (B): <10 mg/dL (ref <10)

## 2020-12-14 LAB — RESP PANEL BY RT-PCR (FLU A&B, COVID) ARPGX2
Influenza A by PCR: NEGATIVE
Influenza B by PCR: NEGATIVE
SARS Coronavirus 2 by RT PCR: NEGATIVE

## 2020-12-14 LAB — TSH: TSH: 2.268 u[IU]/mL (ref 0.350–4.500)

## 2020-12-14 LAB — POC SARS CORONAVIRUS 2 AG -  ED: SARS Coronavirus 2 Ag: NEGATIVE

## 2020-12-14 MED ORDER — ARIPIPRAZOLE 10 MG PO TABS
10.0000 mg | ORAL_TABLET | Freq: Every day | ORAL | Status: DC
  Administered 2020-12-14: 10 mg via ORAL

## 2020-12-14 MED ORDER — MAGNESIUM HYDROXIDE 400 MG/5ML PO SUSP: 30.0000 mL | Freq: Every day | ORAL | Status: DC | PRN

## 2020-12-14 MED ORDER — ALUM & MAG HYDROXIDE-SIMETH 200-200-20 MG/5ML PO SUSP: 30.0000 mL | ORAL | Status: DC | PRN

## 2020-12-14 MED ORDER — ACETAMINOPHEN 325 MG PO TABS: 650.0000 mg | ORAL_TABLET | Freq: Four times a day (QID) | ORAL | Status: DC | PRN

## 2020-12-14 MED ORDER — HYDROXYZINE HCL 25 MG PO TABS: 25.0000 mg | ORAL_TABLET | Freq: Three times a day (TID) | ORAL | Status: DC | PRN

## 2020-12-14 MED ORDER — HYDROXYZINE PAMOATE 25 MG PO CAPS
25.0000 mg | ORAL_CAPSULE | Freq: Three times a day (TID) | ORAL | Status: DC | PRN
  Filled 2020-12-14: qty 1

## 2020-12-14 MED ORDER — ARIPIPRAZOLE 10 MG PO TABS: 10.0000 mg | ORAL_TABLET | Freq: Every day | ORAL | 0 refills | Status: DC

## 2020-12-14 NOTE — ED Provider Notes (Signed)
Behavioral Health Admission H&P [facility] & OBS)  Date: 12/14/20 Patient Name: [name] [name] MRN: [id number] Chief Complaint:  Chief Complaint  Patient presents with   Anxiety   Depression      Diagnoses:  Final diagnoses:  Schizoaffective disorder, bipolar type (HCC)  Cannabis use disorder    HPI: [name] [name] is a 20 y.o. with a history of schizoaffective disorder-bipolar type and marijuana use who presents to [facility] voluntarily with Patent examiner. Patient states that he and his bilogicla brother walked to a store tonight. He states that his God Brother followed them. He states that he an his God Brother got into an argument about him following them. Patient states that the argument continued when they returned home. He states that he contacted his Aunt and asked if he could go to her place because "I can't live with them anymore." Referring to his God Brother and his God Brother's girlfriend.   Patient reports that he has been living with his God Brother, his God Brother's girlfriend, and his biological brother since his mother passed away. He states that he and his God Brother have been arguing and he does not want to return there.  Patient was last inpatient at [facility] in 03/2020. Patient was discharged on abilify 10 mg daily and received Abilify Maintena 400 mg on 03/23/2020. Patient states that he has not taken medication or received an injection in several months. Patient is willing to restart medication.  On evaluation patient is alert and oriented x 4, pleasant, and cooperative. Speech is clear and coherent. Mood is depressed and affect is congruent with mood. Thought process is coherent and thought content is logical. Denies current auditory and visual hallucinations. However, reports that he has been hearing a voice that whispers and has an evil laugh. Reports last hearing the voice two days ago. Denies visual hallucinations. No indication that patient is responding to internal  stimuli. Reports feeling paranoid that people are trying to hurt him. Denies suicidal ideations. Denies homicidal ideations. Reports daily use of marijuana. Denies use of alcohol and other substances.   PHQ 2-9:  Flowsheet Row ED from 12/14/2020 in [facility] ED from 10/14/2019 in [facility] EMERGENCY DEPARTMENT ED from 08/20/2019 in [facility]  Thoughts that you would be better off dead, or of hurting yourself in some way Several days More than half the days More than half the days  PHQ-9 Total Score Flowsheet Row ED from 12/14/2020 in [facility] ED from 05/06/2020 in [facility] EMERGENCY DEPARTMENT ED from 03/23/2020 in [facility]  C-SSRS RISK CATEGORY Moderate Risk High Risk High Risk        Total Time spent with patient: 30 minutes  Musculoskeletal  Strength & Muscle Tone: within normal limits Gait & Station: normal Patient leans: N/A  Psychiatric Specialty Exam  Presentation General Appearance: Appropriate for Environment; Fairly Groomed  Eye Contact:Fair  Speech:Clear and Coherent; Normal Rate  Speech Volume:Normal  Handedness:Right   Mood and Affect  Mood:Depressed; Anxious  Affect:Blunt   Thought Process  Thought Processes:Coherent  Descriptions of Associations:Intact  Orientation:Full (Time, Place and Person)  Thought Content:Paranoid Ideation  Diagnosis of Schizophrenia or Schizoaffective disorder in past: Yes  Duration of Psychotic Symptoms: Greater than six months  Hallucinations:Hallucinations: Auditory Description of Auditory Hallucinations: hears an evil laugh  Ideas of Reference:Paranoia  Suicidal Thoughts:Suicidal Thoughts: No  Homicidal Thoughts:Homicidal Thoughts: No   Sensorium  Memory:Immediate Good; Recent Good  Judgment:Impaired  Insight:Lacking   Executive  Functions  Concentration:Fair  Attention Span:Fair  Recall:Fair  Fund of Knowledge:Fair  Language:Good   Psychomotor Activity  Psychomotor Activity:Psychomotor Activity: Normal   Assets  Assets:Communication Skills; Desire for Improvement; Physical Health   Sleep  Sleep:Sleep: Fair   Nutritional Assessment (For OBS and FBC admissions only) Has the patient had a weight loss or gain of 10 pounds or more in the last 3 months?: No Has the patient had a decrease in food intake/or appetite?: No Does the patient have dental problems?: No Does the patient have eating habits or behaviors that may be indicators of an eating disorder including binging or inducing vomiting?: No Has the patient recently lost weight without trying?: 0 Has the patient been eating poorly because of a decreased appetite?: 0 Malnutrition Screening Tool Score: 0   Physical Exam Constitutional:      General: He is not in acute distress.    Appearance: He is not ill-appearing, toxic-appearing or diaphoretic.  HENT:     Head: Normocephalic.     Right Ear: External ear normal.     Left Ear: External ear normal.  Eyes:     Pupils: Pupils are equal, round, and reactive to light.  Cardiovascular:     Rate and Rhythm: Normal rate.  Pulmonary:     Effort: Pulmonary effort is normal. No respiratory distress.  Musculoskeletal:        General: Normal range of motion.  Skin:    General: Skin is warm and dry.  Neurological:     Mental Status: He is alert and oriented to person, place, and time.  Psychiatric:        Mood and Affect: Mood is anxious and depressed.        Speech: Speech normal.        Behavior: Behavior is cooperative.        Thought Content: Thought content is paranoid. Thought content does not include homicidal or suicidal ideation. Thought content does not include suicidal plan.   Review of Systems  Constitutional:  Negative for chills, diaphoresis, fever, malaise/fatigue and weight loss.   HENT:  Negative for congestion.   Respiratory:  Negative for cough and shortness of breath.   Cardiovascular:  Negative for chest pain and palpitations.  Gastrointestinal:  Negative for diarrhea, nausea and vomiting.  Neurological:  Negative for dizziness and seizures.  Psychiatric/Behavioral:  Positive for depression, hallucinations and substance abuse. Negative for memory loss and suicidal ideas. The patient is nervous/anxious and has insomnia.   All other systems reviewed and are negative.  Blood pressure 138/85, pulse (!) 55, temperature 97.6 F (36.4 C), temperature source Oral, resp. rate 18, SpO2 100 %. There is no height or weight on file to calculate BMI.  Past Psychiatric History: Schizoaffective disorder-bipolar type  Is the patient at risk to self? No  Has the patient been a risk to self in the past 6 months? Yes .    Has the patient been a risk to self within the distant past? Yes   Is the patient a risk to others? No   Has the patient been a risk to others in the past 6 months? No   Has the patient been a risk to others within the distant past? No   Past Medical History:  Past Medical History:  Diagnosis Date   ADHD (attention deficit hyperactivity disorder)    Aggressive behavior  08/24/2018   Asthma    Bipolar 1 disorder (HCC)    Depression    Learning disability    Medical history non-contributory    Seasonal allergies    Shock from electroshock gun (taser) 08/24/2018   Patient was Tased by [employer] at [facility] [address] due to aggresive and hostile behavior.   No past surgical history on file.  Family History: No family history on file.  Social History:  Social History   Socioeconomic History   Marital status: Single    Spouse name: Not on file   Number of children: Not on file   Years of education: Not on file   Highest education level: Not on file  Occupational History   Occupation: Unemployed  Tobacco Use   Smoking status: Every Day     Packs/day: 1.00    Types: Cigarettes   Smokeless tobacco: Never  Vaping Use   Vaping Use: Never used  Substance and Sexual Activity   Alcohol use: No   Drug use: Yes    Frequency: 3.0 times per week    Types: Marijuana    Comment: 2-3 blunts at a time   Sexual activity: Never  Other Topics Concern   Not on file  Social History Narrative   Pt stated that he lives with his godfather.  He is unemployed, and he is followed by [employer].   Social Determinants of Health   Financial Resource Strain: Not on file  Food Insecurity: Not on file  Transportation Needs: Not on file  Physical Activity: Not on file  Stress: Not on file  Social Connections: Not on file  Intimate Partner Violence: Not on file    SDOH:  SDOH Screenings   Alcohol Screen: Not on file  Depression (PHQ2-9): Medium Risk   PHQ-2 Score: 12  Financial Resource Strain: Not on file  Food Insecurity: Not on file  Housing: Not on file  Physical Activity: Not on file  Social Connections: Not on file  Stress: Not on file  Tobacco Use: High Risk   Smoking Tobacco Use: Every Day   Smokeless Tobacco Use: Never   Passive Exposure: Not on file  Transportation Needs: Not on file    Last Labs:  Admission on 12/14/2020  Component Date Value Ref Range Status   SARS Coronavirus 2 Ag 12/14/2020 Negative  Negative Preliminary   POC Amphetamine UR 12/14/2020 None Detected  NONE DETECTED (Cut Off Level 1000 ng/mL) Final   POC Secobarbital (BAR) 12/14/2020 None Detected  NONE DETECTED (Cut Off Level 300 ng/mL) Final   POC Buprenorphine (BUP) 12/14/2020 None Detected  NONE DETECTED (Cut Off Level 10 ng/mL) Final   POC Oxazepam (BZO) 12/14/2020 None Detected  NONE DETECTED (Cut Off Level 300 ng/mL) Final   POC Cocaine UR 12/14/2020 None Detected  NONE DETECTED (Cut Off Level 300 ng/mL) Final   POC Methamphetamine UR 12/14/2020 None Detected  NONE DETECTED (Cut Off Level 1000 ng/mL) Final   POC Morphine 12/14/2020 None Detected   NONE DETECTED (Cut Off Level 300 ng/mL) Final   POC Oxycodone UR 12/14/2020 None Detected  NONE DETECTED (Cut Off Level 100 ng/mL) Final   POC Methadone UR 12/14/2020 None Detected  NONE DETECTED (Cut Off Level 300 ng/mL) Final   POC Marijuana UR 12/14/2020 Positive (A)  NONE DETECTED (Cut Off Level 50 ng/mL) Final    Allergies: Haloperidol and related and Trazodone and nefazodone  PTA Medications:  Current Outpatient Medications  Medication Instructions   ARIPiprazole (ABILIFY) 10  mg, Oral, Daily at bedtime   ARIPiprazole lauroxil ER (ARISTADA INITIO) 675 MG/2.4ML prefilled syringe INJECT 2.4 MILLILITERS (675 MG) BY INTRAMUSCULAR ROUTE ONCE   budesonide-formoterol (SYMBICORT) 80-4.5 MCG/ACT inhaler INHALE 1 TO 2 PUFFS BY MOUTH PRIOR TO EXERCISE. MAY USE 1 TO 2 PUFFS EVERY 4 HOURS AS NEEDED FOR WHEEZING OR SHORTNESS OF BREATH.   budesonide-formoterol (SYMBICORT) 80-4.5 MCG/ACT inhaler INHALE 1 TO 2 PUFFS BY MOUTH PRIOR TO EXERCISE. MAY USE 1 TO 2 PUFFS EVERY 4 HOURS AS NEEDED FOR WHEEZING OR SHORTNESS OF BREATH.   clindamycin (CLEOCIN) 150 MG capsule TAKE 2 CAPSULES BY MOUTH EVERY 8 HOURS UNTIL GONE   divalproex (DEPAKOTE) 500 mg, Oral, Every 12 hours   hydrOXYzine (VISTARIL) 25 mg, Oral, 3 times daily PRN   ibuprofen (ADVIL) 600 MG tablet TAKE 1 TABLET BY MOUTH EVERY 6 HOURS FOR 3 DAYS THEN 1 TABLET EVERY 6 HOURS AS NEEDED FOR PAIN   Melatonin 10 mg, Oral, Daily at bedtime   mirtazapine (REMERON) 15 MG tablet TAKE 1 TABLET BY MOUTH ONCE A DAY BEFORE BEDTIME   OLANZapine zydis (ZYPREXA) 10 MG disintegrating tablet TAKE 1 TABLET (10 MG) AND PLACE ON TOP OF THE TONGUE WHERE IT WILL DISSOLVE, THEN SWALLOW, ONCE DAILY FOR 7 DAYS   QUEtiapine (SEROQUEL) 300 MG tablet Oral     Medical Decision Making  Admit to continuous assessment for crisis stabilization  Restart Abilify 10 mg daily for schizoaffective disorder-first dose now  Allergies  Allergen Reactions   Haloperidol And Related  Anaphylaxis, Swelling and Other (See Comments)    Tongue numb and swollen - possible reaction to haloperidol and/or trazodone 01/08/18   Trazodone And Nefazodone Anaphylaxis, Swelling and Other (See Comments)    Tongue numb and swollen - possible reaction to haloperidol and/or trazodone 01/08/18     Meds ordered this encounter  Medications   ARIPiprazole (ABILIFY) tablet 10 mg   hydrOXYzine (VISTARIL) capsule 25 mg   acetaminophen (TYLENOL) tablet 650 mg   alum & mag hydroxide-simeth (MAALOX/MYLANTA) 200-200-20 MG/5ML suspension 30 mL   magnesium hydroxide (MILK OF MAGNESIA) suspension 30 mL     Lab Orders         Resp Panel by RT-PCR (Flu A&B, Covid) Nasopharyngeal Swab         CBC with Differential/Platelet         Comprehensive metabolic panel         Hemoglobin A1c         Ethanol         Lipid panel         TSH         POC SARS Coronavirus 2 Ag-ED - Nasal Swab         POCT Urine Drug Screen - ([facility])        Recommendations  Based on my evaluation the patient does not appear to have an emergency medical condition.  Jackelyn Poling, NP 12/14/20  5:55 AM

## 2020-12-14 NOTE — ED Provider Notes (Signed)
FBC/OBS ASAP Discharge Summary  Date and Time: 12/14/2020 12:14 PM  Name: [name] [name]  MRN:  [id number]   Discharge Diagnoses:  Final diagnoses:  Schizoaffective disorder, bipolar type (HCC)  Cannabis use disorder    Subjective: [name] [name], 20 y.o., male patient seen face to face by this provider, chart reviewed and discussed with Dr. Bronwen Betters on 12/14/20.  Patient initially presented to [facility] voluntarily with law enforcement after an argument with his God Brother whom he lives with.  Patient was assessed and admitted to the continuous assessment unit.  On evaluation [name] [name] states, "I am ready to go home".  He is calm and cooperative.  He is alert and oriented.  He makes good eye contact.  His speech is at a moderate volume, rate and tone. He endorses depression, with a flat affect.  Reports he slept well last night after taking the Abilify.  He tolerated medications without any adverse reactions.  He denies any concerns with appetite.  He does not appear to be responding to internal/external stimuli.  He denies auditory and visual hallucinations during this assessment.  Denies any paranoia or delusional thought.  He denies suicidal/self-harm/homicidal ideations.  Patient contracts for safety for himself and others.  Denies access to firearms/weapons.  Patient remained calm and cooperative on the unit.  He reports things got out of hand when he was arguing with his godbrother last night.  States he does want to return back to his house.  Collateral: [name] [name], patient's biological brother.  States he has no immediate safety concerns with patient on being discharged home to his godmother's house.  States there is no need to call the godbrother's home and he did not provide a phone number.  States, "he will be fine".  Also reports patient does not have a legal guardian states patient is his own legal guardian.   Stay Summary:   [name] [name]'s improvement was monitored by  continuous assessment/observation and his report of symptom reduction.  His emotional and mental status was also monitored by staff.  He was discharged with resources for behavioral health outpatient services on the second floor including open access walk-in hours.  Patient was discharged with a 14-day prescription for Abilify 10 mg nightly.       Upon completion of this admission the [name] [name] was both mentally and medically stable for discharge denying suicidal/homicidal ideation, auditory/visual/tactile hallucinations, delusional thoughts and paranoia.     Total Time spent with patient: 30 minutes  Past Psychiatric History: See H&P Past Medical History:  Past Medical History:  Diagnosis Date   ADHD (attention deficit hyperactivity disorder)    Aggressive behavior 08/24/2018   Asthma    Bipolar 1 disorder (HCC)    Depression    Learning disability    Medical history non-contributory    Seasonal allergies    Shock from electroshock gun (taser) 08/24/2018   Patient was Tased by [employer] at [facility] street due to aggresive and hostile behavior.   No past surgical history on file. Family History: No family history on file. Family Psychiatric History: See H&P Social History:  Social History   Substance and Sexual Activity  Alcohol Use No     Social History   Substance and Sexual Activity  Drug Use Yes   Frequency: 3.0 times per week   Types: Marijuana   Comment: 2-3 blunts at a time    Social History   Socioeconomic History   Marital status: Single  Spouse name: Not on file   Number of children: Not on file   Years of education: Not on file   Highest education level: Not on file  Occupational History   Occupation: Unemployed  Tobacco Use   Smoking status: Every Day    Packs/day: 1.00    Types: Cigarettes   Smokeless tobacco: Never  Vaping Use   Vaping Use: Never used  Substance and Sexual Activity   Alcohol use: No   Drug use: Yes    Frequency: 3.0  times per week    Types: Marijuana    Comment: 2-3 blunts at a time   Sexual activity: Never  Other Topics Concern   Not on file  Social History Narrative   Pt stated that he lives with his godfather.  He is unemployed, and he is followed by [employer].   Social Determinants of Health   Financial Resource Strain: Not on file  Food Insecurity: Not on file  Transportation Needs: Not on file  Physical Activity: Not on file  Stress: Not on file  Social Connections: Not on file   SDOH:  SDOH Screenings   Alcohol Screen: Not on file  Depression (PHQ2-9): Medium Risk   PHQ-2 Score: 12  Financial Resource Strain: Not on file  Food Insecurity: Not on file  Housing: Not on file  Physical Activity: Not on file  Social Connections: Not on file  Stress: Not on file  Tobacco Use: High Risk   Smoking Tobacco Use: Every Day   Smokeless Tobacco Use: Never   Passive Exposure: Not on file  Transportation Needs: Not on file    Tobacco Cessation:  N/A, patient does not currently use tobacco products  Current Medications:  Current Facility-Administered Medications  Medication Dose Route Frequency Provider Last Rate Last Admin   acetaminophen (TYLENOL) tablet 650 mg  650 mg Oral Q6H PRN Jackelyn Poling, NP       alum & mag hydroxide-simeth (MAALOX/MYLANTA) 200-200-20 MG/5ML suspension 30 mL  30 mL Oral Q4H PRN Nira Conn A, NP       ARIPiprazole (ABILIFY) tablet 10 mg  10 mg Oral QHS Nira Conn A, NP   10 mg at 12/14/20 0545   hydrOXYzine (ATARAX) tablet 25 mg  25 mg Oral TID PRN Ardis Hughs, NP       magnesium hydroxide (MILK OF MAGNESIA) suspension 30 mL  30 mL Oral Daily PRN Jackelyn Poling, NP       Current Outpatient Medications  Medication Sig Dispense Refill   ibuprofen (ADVIL) 200 MG tablet Take 800 mg by mouth every 6 (six) hours as needed for headache.      PTA Medications: (Not in a hospital admission)   Musculoskeletal  Strength & Muscle Tone: within normal  limits Gait & Station: normal Patient leans: N/A  Psychiatric Specialty Exam  Presentation  General Appearance: Appropriate for Environment; Fairly Groomed  Eye Contact:Fair  Speech:Clear and Coherent; Normal Rate  Speech Volume:Normal  Handedness:Right   Mood and Affect  Mood:Depressed  Affect:Congruent   Thought Process  Thought Processes:Coherent  Descriptions of Associations:Intact  Orientation:Full (Time, Place and Person)  Thought Content:Logical  Diagnosis of Schizophrenia or Schizoaffective disorder in past: No  Duration of Psychotic Symptoms: Greater than six months   Hallucinations:Hallucinations: None Description of Auditory Hallucinations: hears an evil laugh  Ideas of Reference:None  Suicidal Thoughts:Suicidal Thoughts: No  Homicidal Thoughts:Homicidal Thoughts: No   Sensorium  Memory:Immediate Good; Recent Good; Remote Good  Judgment:Fair  Insight:Fair  Executive Functions  Concentration:Fair  Attention Span:Fair  Recall:Fair  [employer] of Knowledge:Fair  Language:Fair   Psychomotor Activity  Psychomotor Activity:Psychomotor Activity: Normal   Assets  Assets:Communication Skills; Desire for Improvement; Housing; Physical Health; Resilience   Sleep  Sleep:Sleep: Good   Nutritional Assessment (For OBS and FBC admissions only) Has the patient had a weight loss or gain of 10 pounds or more in the last 3 months?: No Has the patient had a decrease in food intake/or appetite?: No Does the patient have dental problems?: No Does the patient have eating habits or behaviors that may be indicators of an eating disorder including binging or inducing vomiting?: No Has the patient recently lost weight without trying?: 0 Has the patient been eating poorly because of a decreased appetite?: 0 Malnutrition Screening Tool Score: 0    Physical Exam  Physical Exam Vitals and nursing note reviewed.  Constitutional:      Appearance: He is  well-developed.  HENT:     Head: Normocephalic and atraumatic.  Eyes:     General:        Right eye: No discharge.        Left eye: No discharge.     Conjunctiva/sclera: Conjunctivae normal.  Cardiovascular:     Rate and Rhythm: Normal rate and regular rhythm.     Heart sounds: No murmur heard. Pulmonary:     Effort: Pulmonary effort is normal. No respiratory distress.  Musculoskeletal:        General: Normal range of motion.     Cervical back: Neck supple.  Skin:    Coloration: Skin is not jaundiced or pale.  Neurological:     Mental Status: He is alert and oriented to person, place, and time.  Psychiatric:        Mood and Affect: Mood is depressed. Affect is flat.        Speech: Speech normal.        Behavior: Behavior normal. Behavior is cooperative.        Thought Content: Thought content normal.        Cognition and Memory: Cognition normal.        Judgment: Judgment is impulsive.   Review of Systems  Constitutional: Negative.   HENT: Negative.    Eyes: Negative.   Respiratory: Negative.    Cardiovascular: Negative.   Gastrointestinal: Negative.   Musculoskeletal: Negative.   Skin: Negative.   Neurological: Negative.   Psychiatric/Behavioral:  Positive for depression.   Blood pressure 138/85, pulse (!) 55, temperature 97.6 F (36.4 C), temperature source Oral, resp. rate 18, SpO2 100 %. There is no height or weight on file to calculate BMI.  Demographic Factors:  Male, Adolescent or young adult, and Low socioeconomic status  Loss Factors: Financial problems/change in socioeconomic status  Historical Factors: Prior suicide attempts and Impulsivity  Risk Reduction Factors:   Sense of responsibility to family, Living with another person, especially a relative, Positive social support, Positive therapeutic relationship, and Positive coping skills or problem solving skills  Continued Clinical Symptoms:  Severe Anxiety and/or Agitation Depression:    Impulsivity Schizophrenia:   Depressive state  Cognitive Features That Contribute To Risk:  None    Suicide Risk:  Minimal: No identifiable suicidal ideation.  Patients presenting with no risk factors but with morbid ruminations; may be classified as minimal risk based on the severity of the depressive symptoms  Plan Of Care/Follow-up recommendations:  Activity:  as tolerated  Diet:  regular   Disposition:  Discharge patient  Outpatient psychiatric resources provided for behavioral health services on the second floor including open access walk-in hours.  Prescription sent to patient's pharmacy for Abilify 10 mg nightly-14-day supply.   No evidence of imminent risk to self or others at present.    Patient does not meet criteria for psychiatric inpatient admission. Discussed crisis plan, support from social network, calling 911, coming to the Emergency Department, and calling Suicide Hotline.   Ardis Hughs, NP 12/14/2020, 12:14 PM

## 2020-12-14 NOTE — ED Notes (Signed)
Pt discharged with  AVS.  AVS reviewed prior to discharge.  Pt alert, oriented, and ambulatory.  Safety maintained.  °
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
## 2020-12-14 NOTE — Discharge Instructions (Addendum)

## 2020-12-14 NOTE — ED Notes (Addendum)
Pt admitted to [facility] due to anxiety and depression. Pt A&O x4, calm and cooperative. Pt denies current SI/HI/AVH. Pt tolerated lab work and skin assessment well. Pt ambulated independently to unit. Oriented to unit/staff. OJ given, pt declined offer for food at this time. No signs of acute distress noted. Will continue to monitor for safety.

## 2020-12-14 NOTE — ED Notes (Signed)
Urine specimen requested pt states unable to void at this time, po fluids encouraged and provided to pt.

## 2020-12-14 NOTE — ED Notes (Signed)
Pt resting quietly with eyes closed.  No pain or discomfort noted/voiced.  Breathing is even and unlabored.  Will continue to monitor for safety.  

## 2020-12-17 ENCOUNTER — Telehealth (HOSPITAL_COMMUNITY): Payer: Self-pay | Admitting: Pediatrics

## 2020-12-17 NOTE — BH Assessment (Signed)
Care Management - Follow Up Discharges   Writer attempted to make contact with patient today and was unsuccessful.  Patient voice mail is full.  Per chart review, patient was provided with outpatient resources.

## 2021-03-02 ENCOUNTER — Emergency Department (HOSPITAL_COMMUNITY)
Admission: EM | Admit: 2021-03-02 | Discharge: 2021-03-03 | Disposition: A | Discharge status: Home / Self Care | Payer: Medicaid Other | Attending: Emergency Medicine | Admitting: Emergency Medicine

## 2021-03-02 ENCOUNTER — Other Ambulatory Visit: Payer: Self-pay

## 2021-03-02 DIAGNOSIS — R45850 Homicidal ideations: Secondary | ICD-10-CM | POA: Insufficient documentation

## 2021-03-02 DIAGNOSIS — Z20822 Contact with and (suspected) exposure to covid-19: Secondary | ICD-10-CM | POA: Diagnosis not present

## 2021-03-02 DIAGNOSIS — F250 Schizoaffective disorder, bipolar type: Secondary | ICD-10-CM | POA: Diagnosis not present

## 2021-03-02 DIAGNOSIS — F32A Depression, unspecified: Secondary | ICD-10-CM | POA: Diagnosis present

## 2021-03-02 DIAGNOSIS — R45851 Suicidal ideations: Secondary | ICD-10-CM | POA: Insufficient documentation

## 2021-03-02 DIAGNOSIS — F1210 Cannabis abuse, uncomplicated: Secondary | ICD-10-CM | POA: Diagnosis not present

## 2021-03-02 LAB — CBC
HCT: 48.9 % (ref 39.0–52.0)
Hemoglobin: 16.6 g/dL (ref 13.0–17.0)
MCH: 32.1 pg (ref 26.0–34.0)
MCHC: 33.9 g/dL (ref 30.0–36.0)
MCV: 94.6 fL (ref 80.0–100.0)
Platelets: 259 10*3/uL (ref 150–400)
RBC: 5.17 MIL/uL (ref 4.22–5.81)
RDW: 12.2 % (ref 11.5–15.5)
WBC: 6.9 10*3/uL (ref 4.0–10.5)
nRBC: 0 % (ref 0.0–0.2)

## 2021-03-02 LAB — ETHANOL: Alcohol, Ethyl (B): <10 mg/dL (ref <10)

## 2021-03-02 LAB — COMPREHENSIVE METABOLIC PANEL
ALT: 47 U/L — ABNORMAL HIGH (ref 0–44)
AST: 29 U/L (ref 15–41)
Albumin: 5.1 g/dL — ABNORMAL HIGH (ref 3.5–5.0)
Alkaline Phosphatase: 54 U/L (ref 38–126)
Anion gap: 9 (ref 5–15)
BUN: 10 mg/dL (ref 6–20)
CO2: 28 mmol/L (ref 22–32)
Calcium: 9.8 mg/dL (ref 8.9–10.3)
Chloride: 104 mmol/L (ref 98–111)
Creatinine, Ser: 0.8 mg/dL (ref 0.61–1.24)
GFR, Estimated: >60 mL/min (ref >60)
Glucose, Bld: 85 mg/dL (ref 70–99)
Potassium: 3.2 mmol/L — ABNORMAL LOW (ref 3.5–5.1)
Sodium: 141 mmol/L (ref 135–145)
Total Bilirubin: 0.6 mg/dL (ref 0.3–1.2)
Total Protein: 8.4 g/dL — ABNORMAL HIGH (ref 6.5–8.1)

## 2021-03-02 LAB — SALICYLATE LEVEL: Salicylate Lvl: <7 mg/dL — ABNORMAL LOW (ref 7.0–30.0)

## 2021-03-02 LAB — ACETAMINOPHEN LEVEL: Acetaminophen (Tylenol), Serum: <10 ug/mL — ABNORMAL LOW (ref 10–30)

## 2021-03-02 NOTE — ED Triage Notes (Signed)
Patient came in with SI. Pt stated  he plans on jumping in front of a moving car. Pt denies HI.

## 2021-03-02 NOTE — ED Provider Notes (Signed)
[facility]-EMERGENCY DEPT Provider Note   CSN: [id number] Arrival date & time: 03/02/21  2300     History  Chief Complaint  Patient presents with   Suicidal    [name] [name] is a 21 y.o. male.  The history is provided by the patient and medical records.   21 y.o. M with hx of bipolar disorder, schizoaffective disorder, oppositional defiant disorder, intellectual disability, presenting to the ED with suicidal ideation.  States he has been under a lot of stress recently and just wants it all to end.  He has plans of jumping in front of traffic or stealing a police officers gun so they will shoot him.  Also has some homicidal thoughts against a few individuals, does not disclose specifically who they are.  He denies any hallucinations.  Smoked marijuana earlier today but denies any other drug or alcohol use.  States he was previously on medications for anxiety/depression, however states sounds like he was having some side effects so they were stopped.  Home Medications Prior to Admission medications   Medication Sig Start Date End Date Taking? Authorizing Provider  ARIPiprazole (ABILIFY) 10 MG tablet Take 1 tablet (10 mg total) by mouth at bedtime. 12/14/20   Ardis Hughs, NP  ibuprofen (ADVIL) 200 MG tablet Take 800 mg by mouth every 6 (six) hours as needed for headache.    [provider]      Allergies    Haloperidol and related and Trazodone and nefazodone    Review of Systems   Review of Systems  Psychiatric/Behavioral:  Positive for suicidal ideas.   All other systems reviewed and are negative.  Physical Exam Updated Vital Signs BP 124/88 (BP Location: Right Arm)    Pulse 88    Temp 98 F (36.7 C) (Oral)    Resp 20    Ht 6' (1.829 m)    Wt 110 kg    SpO2 100%    BMI 32.89 kg/m   Physical Exam Vitals and nursing note reviewed.  Constitutional:      Appearance: He is well-developed.  HENT:     Head: Normocephalic and atraumatic.   Eyes:     Conjunctiva/sclera: Conjunctivae normal.     Pupils: Pupils are equal, round, and reactive to light.  Cardiovascular:     Rate and Rhythm: Normal rate and regular rhythm.     Heart sounds: Normal heart sounds.  Pulmonary:     Effort: Pulmonary effort is normal. No respiratory distress.     Breath sounds: Normal breath sounds. No rhonchi.  Abdominal:     General: Bowel sounds are normal.     Palpations: Abdomen is soft.     Tenderness: There is no abdominal tenderness. There is no rebound.  Musculoskeletal:        General: Normal range of motion.     Cervical back: Normal range of motion.  Skin:    General: Skin is warm and dry.  Neurological:     Mental Status: He is alert and oriented to person, place, and time.  Psychiatric:        Attention and Perception: He does not perceive auditory or visual hallucinations.        Thought Content: Thought content includes homicidal and suicidal ideation. Thought content includes suicidal plan. Thought content does not include homicidal plan.    ED Results / Procedures / Treatments   Labs (all labs ordered are listed, but only abnormal results are displayed) Labs Reviewed  COMPREHENSIVE METABOLIC PANEL - Abnormal; Notable for the following components:      Result Value   Potassium 3.2 (*)    Total Protein 8.4 (*)    Albumin 5.1 (*)    ALT 47 (*)    All other components within normal limits  SALICYLATE LEVEL - Abnormal; Notable for the following components:   Salicylate Lvl <7.0 (*)    All other components within normal limits  ACETAMINOPHEN LEVEL - Abnormal; Notable for the following components:   Acetaminophen (Tylenol), Serum <10 (*)    All other components within normal limits  RESP PANEL BY RT-PCR (FLU A&B, COVID) ARPGX2  ETHANOL  CBC  RAPID URINE DRUG SCREEN, HOSP PERFORMED    EKG None  Radiology No results found.  Procedures Procedures    Medications Ordered in ED Medications - No data to  display  ED Course/ Medical Decision Making/ A&P                           Medical Decision Making Amount and/or Complexity of Data Reviewed Labs: ordered. Radiology: ordered and independent interpretation performed. ECG/medicine tests: ordered and independent interpretation performed.  Risk Prescription drug management.   21 year old male here with suicidal ideation.  Plan to run out into traffic or steal officers gun in order to be shot.  Admits to HI but does not disclose about who.  Denies AVH.  Admits to smoking marijuana today but denies other drug or EtOH use.   AAOx3 on exam here.  He is cooperative with questioning and answering appropriately.  He denies any physical complaints.  Labs were reviewed, potassium is mildly low but otherwise labs are reassuring.  Will be given oral replacement.  Medically clear.  We will plan for TTS evaluation.  5:56 AM No acute events overnight.  TTS consult still pending.  Day team to follow-up on recommendations.  Final Clinical Impression(s) / ED Diagnoses Final diagnoses:  Suicidal ideation    Rx / DC Orders ED Discharge Orders     None         Garlon Hatchet, PA-C 03/03/21 0556    Dione Booze, MD 03/03/21 [phone]

## 2021-03-03 ENCOUNTER — Encounter (HOSPITAL_COMMUNITY): Payer: Self-pay | Admitting: Emergency Medicine

## 2021-03-03 LAB — RESP PANEL BY RT-PCR (FLU A&B, COVID) ARPGX2
Influenza A by PCR: NEGATIVE
Influenza B by PCR: NEGATIVE
SARS Coronavirus 2 by RT PCR: NEGATIVE

## 2021-03-03 MED ORDER — POTASSIUM CHLORIDE CRYS ER 20 MEQ PO TBCR
40.0000 meq | EXTENDED_RELEASE_TABLET | Freq: Once | ORAL | Status: AC
Start: 2021-03-03 — End: 2021-03-03
  Administered 2021-03-03: 40 meq via ORAL
  Filled 2021-03-03: qty 2

## 2021-03-03 NOTE — ED Provider Notes (Signed)
Was just informed that patient has now been psychiatrically medically cleared for discharge and outpatient follow-up.  I assessed patient and he is resting comfortably and agrees with plan of care.  He was given outpatient follow-up instructions and was discharged per psychiatry recommendations.  Clinical Impression: 1. Suicidal ideation     Disposition: Discharge  Condition: Good  I have discussed the results, Dx and Tx plan with the pt(& family if present). He/she/they expressed understanding and agree(s) with the plan. Discharge instructions discussed at great length. Strict return precautions discussed and pt &/or family have verbalized understanding of the instructions. No further questions at time of discharge.    New Prescriptions   No medications on file    Follow Up: No follow-up provider specified.    Ronisha Herringshaw, [name], MD 03/03/21 1104

## 2021-03-03 NOTE — ED Notes (Signed)
Discharge instructions reviewed with patient. Patient understands the resources provided. Gave patient bus pass.

## 2021-03-03 NOTE — BH Assessment (Signed)
@  0828, notified patient's nurse [facility], RN) that this TTS Clinician is available to evaluate patient. Requested the TTS machine to be moved to patient's room.

## 2021-03-03 NOTE — Discharge Instructions (Addendum)
For your behavioral health needs, you are advised to follow up with an outpatient provider.  You may be eligible for ACT Team services, which would include frequent visits with your provider, as well as in-home services.  The following providers offer ACT Team services.  Contact them at your earliest opportunity to ask about enrolling in their program:       [facility]      [address]      [place], [place] [address]      [phone]       [name]      [address]      [place], [place] [address]      [phone]       [facility]      [address]      [place], [place] [address]      [phone]       [facility] ACT Team      [address] [facility] [address]      [address]      [place], [place]  [address]      [phone]       [facility]      [address]      [place], [place] [address]      [phone]   In the meantime you can receive outpatient behavioral health treatment through [facility]:      [facility]      [address]      [place], [place] [address]      [phone]      They offer psychiatry/medication management, therapy and substance use disorder treatment.  New patients are seen in their walk-in clinic.  Walk-in hours are Monday - Thursday from 8:00 am - 11:00 am for psychiatry, and Friday from 1:00 pm - 4:00 pm for therapy.  Walk-in patients are seen on a first come, first served basis, so try to arrive as early as possible for the best chance of being seen the same day.  Please note that to be eligible for services you must bring an ID or a piece of mail with your name and a [facility] address.  Below are listed contacts for local halfway houses.  They are supportive communities that help their residents to maintain a sober lifestyle while remaining integrated in their communities:       [facility] [employer]      [phone]        [employer]      [email or web address]   Shelters in [place]:   [employer]  Address: [address] [place] [address]  Phone: [phone]  [facility] Address: [address] [place], [place], [place] [address]  Phone: [phone]  [facility] Address: [address], [place], [place] [address]  Phone: [phone]  Other supportive services for the homeless:       [facility]      [address] [place], [place] [address]      [phone]
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
## 2021-03-03 NOTE — ED Notes (Signed)
TTS in room with patient. 

## 2021-03-03 NOTE — BH Assessment (Signed)
[facility] Assessment Progress Note   Per Caryn Bee, NP , this voluntary pt does not require psychiatric hospitalization at this time.  Pt is psychiatrically cleared.  Discharge instructions include referral information for ACT Teams, for [facility], for area halfway houses, for area homeless shelters and for the [employer].  EDP Lynden Oxford, MD and pt's nurse, [place], have been notified.  Doylene Canning, MA Triage Specialist [phone]
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
## 2021-04-01 ENCOUNTER — Other Ambulatory Visit: Payer: Self-pay

## 2021-04-01 ENCOUNTER — Emergency Department (HOSPITAL_COMMUNITY)
Admission: EM | Admit: 2021-04-01 | Discharge: 2021-04-02 | Disposition: A | Discharge status: Home / Self Care | Payer: Medicaid Other | Attending: Emergency Medicine | Admitting: Emergency Medicine

## 2021-04-01 ENCOUNTER — Encounter (HOSPITAL_COMMUNITY): Payer: Self-pay

## 2021-04-01 DIAGNOSIS — R45851 Suicidal ideations: Secondary | ICD-10-CM

## 2021-04-01 DIAGNOSIS — F250 Schizoaffective disorder, bipolar type: Secondary | ICD-10-CM | POA: Diagnosis not present

## 2021-04-01 DIAGNOSIS — T1491XA Suicide attempt, initial encounter: Secondary | ICD-10-CM | POA: Diagnosis not present

## 2021-04-01 DIAGNOSIS — F919 Conduct disorder, unspecified: Secondary | ICD-10-CM | POA: Diagnosis not present

## 2021-04-01 DIAGNOSIS — R4589 Other symptoms and signs involving emotional state: Secondary | ICD-10-CM | POA: Diagnosis present

## 2021-04-01 DIAGNOSIS — Z20822 Contact with and (suspected) exposure to covid-19: Secondary | ICD-10-CM | POA: Insufficient documentation

## 2021-04-01 DIAGNOSIS — F79 Unspecified intellectual disabilities: Secondary | ICD-10-CM | POA: Diagnosis not present

## 2021-04-01 DIAGNOSIS — T391X2A Poisoning by 4-Aminophenol derivatives, intentional self-harm, initial encounter: Secondary | ICD-10-CM | POA: Insufficient documentation

## 2021-04-01 DIAGNOSIS — F902 Attention-deficit hyperactivity disorder, combined type: Secondary | ICD-10-CM | POA: Diagnosis not present

## 2021-04-01 LAB — COMPREHENSIVE METABOLIC PANEL
ALT: 23 U/L (ref 0–44)
AST: 24 U/L (ref 15–41)
Albumin: 4.6 g/dL (ref 3.5–5.0)
Alkaline Phosphatase: 46 U/L (ref 38–126)
Anion gap: 11 (ref 5–15)
BUN: 8 mg/dL (ref 6–20)
CO2: 27 mmol/L (ref 22–32)
Calcium: 9.7 mg/dL (ref 8.9–10.3)
Chloride: 104 mmol/L (ref 98–111)
Creatinine, Ser: 1.16 mg/dL (ref 0.61–1.24)
GFR, Estimated: >60 mL/min (ref >60)
Glucose, Bld: 107 mg/dL — ABNORMAL HIGH (ref 70–99)
Potassium: 3.8 mmol/L (ref 3.5–5.1)
Sodium: 142 mmol/L (ref 135–145)
Total Bilirubin: 0.6 mg/dL (ref 0.3–1.2)
Total Protein: 7.4 g/dL (ref 6.5–8.1)

## 2021-04-01 LAB — CBC
HCT: 46.6 % (ref 39.0–52.0)
Hemoglobin: 15.8 g/dL (ref 13.0–17.0)
MCH: 32.3 pg (ref 26.0–34.0)
MCHC: 33.9 g/dL (ref 30.0–36.0)
MCV: 95.3 fL (ref 80.0–100.0)
Platelets: 227 10*3/uL (ref 150–400)
RBC: 4.89 MIL/uL (ref 4.22–5.81)
RDW: 13 % (ref 11.5–15.5)
WBC: 8.9 10*3/uL (ref 4.0–10.5)
nRBC: 0 % (ref 0.0–0.2)

## 2021-04-01 LAB — SALICYLATE LEVEL: Salicylate Lvl: <7 mg/dL — ABNORMAL LOW (ref 7.0–30.0)

## 2021-04-01 LAB — ACETAMINOPHEN LEVEL
Acetaminophen (Tylenol), Serum: 47 ug/mL — ABNORMAL HIGH (ref 10–30)
Acetaminophen (Tylenol), Serum: 31 ug/mL — ABNORMAL HIGH (ref 10–30)

## 2021-04-01 LAB — RESP PANEL BY RT-PCR (FLU A&B, COVID) ARPGX2
Influenza A by PCR: NEGATIVE
Influenza B by PCR: NEGATIVE
SARS Coronavirus 2 by RT PCR: NEGATIVE

## 2021-04-01 LAB — ETHANOL: Alcohol, Ethyl (B): <10 mg/dL (ref <10)

## 2021-04-01 LAB — RAPID URINE DRUG SCREEN, HOSP PERFORMED
Amphetamines: NOT DETECTED
Barbiturates: NOT DETECTED
Benzodiazepines: NOT DETECTED
Cocaine: NOT DETECTED
Opiates: NOT DETECTED
Tetrahydrocannabinol: POSITIVE — AB

## 2021-04-01 MED ORDER — IBUPROFEN 800 MG PO TABS
800.0000 mg | ORAL_TABLET | Freq: Once | ORAL | Status: AC
  Administered 2021-04-01: 800 mg via ORAL
  Filled 2021-04-01: qty 1

## 2021-04-01 MED ORDER — ARIPIPRAZOLE 10 MG PO TABS
10.0000 mg | ORAL_TABLET | Freq: Every day | ORAL | Status: DC
  Administered 2021-04-01: 10 mg via ORAL
  Filled 2021-04-01: qty 1

## 2021-04-01 MED ORDER — LORAZEPAM 1 MG PO TABS
1.0000 mg | ORAL_TABLET | Freq: Once | ORAL | Status: AC
Start: 2021-04-01 — End: 2021-04-01
  Administered 2021-04-01: 1 mg via ORAL
  Filled 2021-04-01: qty 1

## 2021-04-01 MED ORDER — LORAZEPAM 1 MG PO TABS: 1.0000 mg | ORAL_TABLET | ORAL | Status: DC | PRN

## 2021-04-01 MED ORDER — OLANZAPINE 5 MG PO TBDP
5.0000 mg | ORAL_TABLET | Freq: Three times a day (TID) | ORAL | Status: DC | PRN
  Administered 2021-04-01: 5 mg via ORAL
  Filled 2021-04-01: qty 1

## 2021-04-01 MED ORDER — ZIPRASIDONE MESYLATE 20 MG IM SOLR
20.0000 mg | INTRAMUSCULAR | Status: DC | PRN
  Filled 2021-04-01: qty 20
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
## 2021-04-01 NOTE — ED Notes (Signed)
Pt requesting to speak with a doctor. Delo MD made aware ?

## 2021-04-01 NOTE — ED Notes (Signed)
Pt becoming increasingly impatient waiting for TTS. While exiting RM next to pt's, heard slamming noise from pt's room. Pt was punching the wall and stopped. Pt was asked to stop hitting the walls. Pt stopped did not verbally respond but stopped hitting objects. Pt continued to pace in room with clinched fists for a few minutes and then continued to walk around doorway with blanket. Pt refuses PO meds.  ?

## 2021-04-01 NOTE — ED Notes (Signed)
Patient walked out of the ems doors , states he has court and was going to leave. Security and [employer] went after patient and brought him back into the ED. IVC papers in progress.  ?

## 2021-04-01 NOTE — ED Notes (Signed)
Pt encouraged to lay down and try to get some rest. Pt agreed to lay down and was given warm blankets and a pillow.  ? ?

## 2021-04-01 NOTE — ED Provider Notes (Signed)
Emergency Medicine Observation Re-evaluation Note ? ?[name] [name] is a 21 y.o. male, seen on rounds today.  Pt initially presented to the ED for complaints of Psychiatric Evaluation ?Currently, the patient is under IVC for self-harm and possible suicide attempt. ? ?Physical Exam  ?BP 138/88 (BP Location: Left Arm)   Pulse 66   Temp 98.6 ?F (37 ?C) (Oral)   Resp 20   SpO2 100%  ?Physical Exam ?General: Pacing around the room ?Cardiac: No murmur ?Lungs: Clear bilaterally ?Psych: Anxious and wanting to talk to psychiatry team ? ?ED Course / MDM  ?EKG:  ? ?I have reviewed the labs performed to date as well as medications administered while in observation.  Recent changes in the last 24 hours include placed under IVC and awaiting psychiatric recommendations. ? ?Plan  ?Current plan is for under IVC awaiting psychiatric recommendations. ?[name] [name] is under involuntary commitment. ?  ? ?  ?[name] [name], [name], MD ?04/01/21 7628 ? ?

## 2021-04-01 NOTE — ED Notes (Signed)
Pt continuing to squirm in bed and is becoming increasingly agitated. Pt stated the medicine is not working and is requesting security  ?

## 2021-04-01 NOTE — ED Provider Notes (Addendum)
?[facility] EMERGENCY DEPARTMENT ?Provider Note ? ? ?CSN: [id number] ?Arrival date & time: 04/01/21  0235 ? ?  ? ?History ? ?Chief Complaint  ?Patient presents with  ? Psychiatric Evaluation  ? ? ?[name] [name] is a 21 y.o. male. ? ?The history is provided by the patient and medical records.  ? ?21 year old male presenting to the ED with suicidal ideation.  States around 1:10 AM he took approximately 10 extra strength Tylenol.  States "I was having a headache, but also wanted to hurt myself".  Denies any other co-ingestion.  Denies EtOH or illicit drug use.  Denies HI.   Seen for SI last week as well. ? ?Home Medications ?Prior to Admission medications   ?Medication Sig Start Date End Date Taking? Authorizing Provider  ?ARIPiprazole (ABILIFY) 10 MG tablet Take 1 tablet (10 mg total) by mouth at bedtime. 12/14/20   Ardis Hughs, NP  ?ibuprofen (ADVIL) 200 MG tablet Take 800 mg by mouth every 6 (six) hours as needed for headache.    [provider]  ?   ? ?Allergies    ?Haloperidol and related and Trazodone and nefazodone   ? ?Review of Systems   ?Review of Systems  ?Psychiatric/Behavioral:  Positive for suicidal ideas.   ?All other systems reviewed and are negative. ? ?Physical Exam ?Updated Vital Signs ?BP 138/88 (BP Location: Left Arm)   Pulse 66   Temp 98.6 ?F (37 ?C) (Oral)   Resp 20   SpO2 100%  ? ?Physical Exam ?Vitals and nursing note reviewed.  ?Constitutional:   ?   Appearance: He is well-developed.  ?HENT:  ?   Head: Normocephalic and atraumatic.  ?Eyes:  ?   Conjunctiva/sclera: Conjunctivae normal.  ?   Pupils: Pupils are equal, round, and reactive to light.  ?Cardiovascular:  ?   Rate and Rhythm: Normal rate and regular rhythm.  ?   Heart sounds: Normal heart sounds.  ?Pulmonary:  ?   Effort: Pulmonary effort is normal.  ?   Breath sounds: Normal breath sounds.  ?Abdominal:  ?   General: Bowel sounds are normal.  ?   Palpations: Abdomen is soft.  ?Musculoskeletal:      ?   General: Normal range of motion.  ?   Cervical back: Normal range of motion.  ?Skin: ?   General: Skin is warm and dry.  ?Neurological:  ?   Mental Status: He is alert and oriented to person, place, and time.  ?Psychiatric:  ?   Comments: Flat affect, will not make eye contact, SI with OD this morning, denies HI/AVH  ? ? ?ED Results / Procedures / Treatments   ?Labs ?(all labs ordered are listed, but only abnormal results are displayed) ?Results for orders placed or performed during the hospital encounter of 04/01/21  ?Resp Panel by RT-PCR (Flu A&B, Covid) Nasopharyngeal Swab  ? Specimen: Nasopharyngeal Swab; Nasopharyngeal(NP) swabs in vial transport medium  ?Result Value Ref Range  ? SARS Coronavirus 2 by RT PCR NEGATIVE NEGATIVE  ? Influenza A by PCR NEGATIVE NEGATIVE  ? Influenza B by PCR NEGATIVE NEGATIVE  ?Comprehensive metabolic panel  ?Result Value Ref Range  ? Sodium 142 135 - 145 mmol/L  ? Potassium 3.8 3.5 - 5.1 mmol/L  ? Chloride 104 98 - 111 mmol/L  ? CO2 27 22 - 32 mmol/L  ? Glucose, Bld 107 (H) 70 - 99 mg/dL  ? BUN 8 6 - 20 mg/dL  ? Creatinine, Ser 1.16 0.61 - 1.24  mg/dL  ? Calcium 9.7 8.9 - 10.3 mg/dL  ? Total Protein 7.4 6.5 - 8.1 g/dL  ? Albumin 4.6 3.5 - 5.0 g/dL  ? AST 24 15 - 41 U/L  ? ALT 23 0 - 44 U/L  ? Alkaline Phosphatase 46 38 - 126 U/L  ? Total Bilirubin 0.6 0.3 - 1.2 mg/dL  ? GFR, Estimated >60 >60 mL/min  ? Anion gap 11 5 - 15  ?Ethanol  ?Result Value Ref Range  ? Alcohol, Ethyl (B) <10 <10 mg/dL  ?Salicylate level  ?Result Value Ref Range  ? Salicylate Lvl <7.0 (L) 7.0 - 30.0 mg/dL  ?Acetaminophen level  ?Result Value Ref Range  ? Acetaminophen (Tylenol), Serum 47 (H) 10 - 30 ug/mL  ?cbc  ?Result Value Ref Range  ? WBC 8.9 4.0 - 10.5 K/uL  ? RBC 4.89 4.22 - 5.81 MIL/uL  ? Hemoglobin 15.8 13.0 - 17.0 g/dL  ? HCT 46.6 39.0 - 52.0 %  ? MCV 95.3 80.0 - 100.0 fL  ? MCH 32.3 26.0 - 34.0 pg  ? MCHC 33.9 30.0 - 36.0 g/dL  ? RDW 13.0 11.5 - 15.5 %  ? Platelets 227 150 - 400 K/uL  ? nRBC  0.0 0.0 - 0.2 %  ? ?No results found. ? ? ? ?EKG ?None ? ?Radiology ?No results found. ? ?Procedures ?Procedures  ? ? ?Medications Ordered in ED ?Medications - No data to display ? ?ED Course/ Medical Decision Making/ A&P ?  ?                        ?Medical Decision Making ?Amount and/or Complexity of Data Reviewed ?Labs: ordered. ? ? ?21 year old male presenting to the ED with SI.  States around 1:10 AM this morning he took 10 extra strength Tylenol.  Does admit he had intended self-harm.  Denies any other coingestions, alcohol, or illicit drug use.  Patient does have a history of suicidal behavior in the past.  We will check labs. ? ?Labs reviewed-- normal LFT's.  No electrolyte derangement.  Initial APAP level 47.  Negative salicylates and ethanol.  UDS pending. Will repeat 4 hour post-ingestion APAP level at 5am. ? ?4:31 AM ?Patient ran out of EMS bay doors, reported to staff that he had court in the morning. As patient intentionally overdosed and has positive tylenol levels, cannot medically clear and do not feel he is stable.  IVC paperwork has been filed. ? ?5:57 AM ?Repeat tylenol level is downtrending, now just over threshold of normal at 31.  Feel he can be medically cleared now.  Will get TTS evaluation.  Holding orders in place. ? ?6:04 AM ?RN reports patient agitated, pacing in and out of room.  States he has "pain" and has not slept in 2 days.  Will give motrin as he needs to avoid tylenol.  Given dose of ativan as well to see if this helps him calm down. ? ?Final Clinical Impression(s) / ED Diagnoses ?Final diagnoses:  ?Intentional acetaminophen overdose, initial encounter (HCC)  ?Suicidal ideation  ? ? ?Rx / DC Orders ?ED Discharge Orders   ? ? None  ? ?  ? ? ?  ?Garlon Hatchet, PA-C ?04/01/21 0603 ? ?  ?Garlon Hatchet, PA-C ?04/01/21 7124 ? ?  ?Geoffery Lyons, MD ?04/01/21 [phone] ? ?

## 2021-04-01 NOTE — ED Notes (Addendum)
Pt pacing in room and took sheets off of the bed. When asked what was wrong, pt stated he needed to leave. Pt informed that he has to stay and be evaluated by TTS. RN offered new sheets and blankets so pt could try to get some rest. Pt declined stating he has not been to sleep in two days due to his toothache. Pt currently sitting in chair with his head down. [name] made aware ?

## 2021-04-01 NOTE — ED Triage Notes (Signed)
Pt brought to ED by [facility] with c/o SI. EMS reports pt took approximately 10 regular strength tylenol in attempts to kill himself and has been experiencing visual hallucinations. Has hx of the same.  ?

## 2021-04-01 NOTE — ED Notes (Signed)
Security and [employer] at bedside. 

## 2021-04-01 NOTE — ED Notes (Signed)
Patient states he isn't able to urinate at this time.  ?

## 2021-04-01 NOTE — ED Notes (Signed)
Patient belongings inventoried and put in locker #3 and phone , Consulting civil engineer , headphones and bus card put in security.  ?

## 2021-04-01 NOTE — Progress Notes (Signed)
Inpatient Behavioral Health Placement ? ?Pt meets inpatient criteria per Arvilla Market, NP. There are no appropriate beds at [facility] per Rosey Bath, RN.  Referral was sent to the following facilities;  ? ?Destination ?Service Provider Address Phone Fax  ?[facility]  [address], [place] [address] [phone]  ?[facility]  [address] [place], [place] [address] [phone]  ?[facility] [address] [place] [address] [phone]  ?[facility]  [address] [place], [place] [address] [phone]  ?[facility]  [address] [place] [address] [phone]  ?[facility]  [address] [place] [address] [phone]  ?[facility]  [address], [place] [address] [phone]  ?[facility]  [address], [place] [address] [phone]  ?[facility]  [address] [place] [address] [phone]  ?[facility]  [address] [place] [place] [address] [phone]  ?[facility]  [address], [place] [address] [phone]  ?[facility]  [address], [place] [address] [phone]  ?[facility]  [address] [place], [place] [address] [phone]  ?[facility]  [address] [place] [address] [phone]  ? ? ?Situation ongoing,  CSW will follow up. ? ? ?Maryjean Ka, MSW, LCSWA ?04/01/2021  @ 10:46 PM ? ?

## 2021-04-02 ENCOUNTER — Encounter (HOSPITAL_COMMUNITY): Payer: Self-pay | Admitting: Registered Nurse

## 2021-04-02 DIAGNOSIS — T1491XA Suicide attempt, initial encounter: Secondary | ICD-10-CM

## 2021-04-02 NOTE — ED Provider Notes (Signed)
Patient was reevaluated with psychiatry team and felt to be reasonably safe and stable for discharge with outpatient follow-up.  Based on these recommendations I agree would be appropriate for outpatient follow-up.  Patient's guardian will be contacted.  His IVC paperwork was rescinded ?  ?Terald Sleeper, MD ?04/02/21 1245 ? ?

## 2021-04-02 NOTE — Progress Notes (Signed)
CSW initiated a Care Coordination referral through the [facility]. CSW spoke with Tamara/Phone#: [phone] who completed referral. The referral was recommended to be expedited. Moreover, a Care Coordinator with attempt to follow-up to connect to services within the upcoming week.   ? ?Glennie Isle, MSW, LCSW-A, LCAS-A ?Phone: [phone] ?Disposition/TOC ?    ?

## 2021-04-02 NOTE — TOC Progression Note (Signed)
Transition of Care (TOC) - Progression Note  ? ? ?Patient Details  ?Name: [name] [name] ?MRN: [id number] ?Date of Birth: 18-Nov-2000 ? ?Transition of Care (TOC) CM/SW Contact  ?Alfredia Ferguson, LCSW ?Phone Number: ?04/02/2021, 3:26 PM ? ?Clinical Narrative:    ?CSW contacted by [facility] and RNCM regarding ACTT/CSTT referral. CSW discussed with patient and received verbal consent for referral. Patient additionally endorsed plan to self-present to [facility] for evaluation and outpatient follow-up planning. CSW sent in referral for ACTT through [employer] and to [place].  ? ? ?  ?  ? ?Expected Discharge Plan and Services ?  ?  ?  ?  ?  ?                ?  ?  ?  ?  ?  ?  ?  ?  ?  ?  ? ? ?Social Determinants of Health (SDOH) Interventions ?  ? ?Readmission Risk Interventions ?No flowsheet data found. ? ?

## 2021-04-02 NOTE — ED Notes (Signed)
Patient using phone 
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
## 2021-04-02 NOTE — ED Notes (Signed)
Patient given discharge instructions, all questions answered. Patient in possession of all belongings, directed to the discharge area  

## 2021-04-02 NOTE — ED Notes (Signed)
Pt stating he "wasn't suicidal and that he would like to leave". This RN and sitter communicated with patient about placement process d/t safety risks for pt, as well as, IVC rules. Pt was calm and understanding.  ?

## 2021-04-02 NOTE — ED Notes (Signed)
Patient on TTS 
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
## 2021-12-14 ENCOUNTER — Other Ambulatory Visit: Payer: Self-pay

## 2021-12-14 ENCOUNTER — Emergency Department (HOSPITAL_BASED_OUTPATIENT_CLINIC_OR_DEPARTMENT_OTHER)
Admission: EM | Admit: 2021-12-14 | Discharge: 2021-12-14 | Disposition: A | Discharge status: Home / Self Care | Payer: Medicaid Other | Attending: Emergency Medicine | Admitting: Emergency Medicine

## 2021-12-14 ENCOUNTER — Encounter (HOSPITAL_BASED_OUTPATIENT_CLINIC_OR_DEPARTMENT_OTHER): Payer: Self-pay

## 2021-12-14 DIAGNOSIS — K047 Periapical abscess without sinus: Secondary | ICD-10-CM | POA: Diagnosis not present

## 2021-12-14 DIAGNOSIS — K0889 Other specified disorders of teeth and supporting structures: Secondary | ICD-10-CM | POA: Diagnosis present

## 2021-12-14 MED ORDER — AMOXICILLIN-POT CLAVULANATE 875-125 MG PO TABS: 1.0000 | ORAL_TABLET | Freq: Two times a day (BID) | ORAL | 0 refills | Status: DC

## 2021-12-14 NOTE — Discharge Instructions (Addendum)
Thank you for allowing me to be part of your care today.  You were seen and evaluated in the ER for a dental abscess.  I have sent over a prescription for Augmentin, please start taking ASAP and take the prescription until completion.  I am referring you to [facility], oral surgery, but have also provided a resource guide for dental services in the area you may use once you determine your Medicaid status.   I recommend continuing to use 800mg  ibuprofen every 6-8 hours as needed for pain.  You can also use ice on the outside of your face for no longer than 20 min at a time to help with swelling.   Return to the ER if you develop new or worsening signs or symptoms or have any concerns.

## 2021-12-14 NOTE — ED Triage Notes (Signed)
Pt. C/o left side toothache " for a while" worsening over"the last few days"

## 2021-12-14 NOTE — ED Provider Notes (Signed)
[facility] EMERGENCY DEPT Provider Note   CSN: [id number] Arrival date & time: 12/14/21  9211     History  Chief Complaint  Patient presents with   Dental Pain    [name] [name] is a 21 y.o. male presents to the ER with complaints of tooth pain that has been going on for the last 2 weeks.  He states it has been on and off, but over the last couple of days it has been worsening and constant.  He has noticed swelling over the left side of his jaw that began today that is very sore.  Denies fevers, difficulty swallowing, shortness of breath, sore throat, ear pain, mouth sores.  He has not seen a dentist and does not currently have regular dental care due to not knowing his Medicaid status.    Dental Pain Associated symptoms: no fever and no oral lesions        Home Medications Prior to Admission medications   Medication Sig Start Date End Date Taking? Authorizing Provider  amoxicillin-clavulanate (AUGMENTIN) 875-125 MG tablet Take 1 tablet by mouth every 12 (twelve) hours. 12/14/21  Yes Lenia Housley R, PA  acetaminophen (TYLENOL) 500 MG tablet Take 1,500 mg by mouth every 6 (six) hours as needed for moderate pain or headache.    [provider]  ARIPiprazole (ABILIFY) 10 MG tablet Take 1 tablet (10 mg total) by mouth at bedtime. 12/14/20   Ardis Hughs, NP  ibuprofen (ADVIL) 200 MG tablet Take 400-600 mg by mouth every 6 (six) hours as needed for headache.    [provider]      Allergies    Haloperidol and related and Trazodone and nefazodone    Review of Systems   Review of Systems  Constitutional:  Negative for fever.  HENT:  Positive for dental problem. Negative for ear pain, mouth sores and trouble swallowing.   Respiratory:  Negative for shortness of breath.     Physical Exam Updated Vital Signs BP (!) 145/86 (BP Location: Right Arm)   Pulse 72   Temp 97.9 F (36.6 C)   Resp 16   SpO2 100%  Physical Exam Vitals and  nursing note reviewed.  Constitutional:      General: He is not in acute distress.    Appearance: Normal appearance. He is not ill-appearing or diaphoretic.  HENT:     Head:     Jaw: There is normal jaw occlusion. Tenderness and swelling present.     Comments: Swelling and tenderness over the left mandible, no erythema or warmth, no malocclusion     Mouth/Throat:     Mouth: Mucous membranes are moist. No oral lesions.     Dentition: Dental tenderness, gingival swelling, dental caries and dental abscesses present. No gum lesions.     Pharynx: Oropharynx is clear. Uvula midline.      Comments: Tooth #19 broken with dental caries, tenderness and gingival swelling, appears to be a dental abscess Cardiovascular:     Rate and Rhythm: Normal rate and regular rhythm.     Heart sounds: Normal heart sounds.  Pulmonary:     Effort: Pulmonary effort is normal.     Breath sounds: Normal breath sounds and air entry.  Neurological:     Mental Status: He is alert. Mental status is at baseline.  Psychiatric:        Mood and Affect: Mood normal.        Behavior: Behavior normal.     ED Results /  Procedures / Treatments   Labs (all labs ordered are listed, but only abnormal results are displayed) Labs Reviewed - No data to display  EKG None  Radiology No results found.  Procedures Procedures    Medications Ordered in ED Medications - No data to display  ED Course/ Medical Decision Making/ A&P                           Medical Decision Making  This patient presents to the ED with chief complaint(s) of left sided dental pain and swelling with pertinent past medical history of impaired access to dental care .The complaint involves an extensive differential diagnosis and also carries with it a high risk of complications and morbidity.    The differential diagnosis includes periodontal abscess, periapical abscess, pulpitis; low suspicion for Ludwig's angina, peritonsillar abscess,  retropharyngeal abscess based on patient complaint and presentation   Initial Assessment:   Exam significant for dental caries and broken dentition to tooth #19, left mandibular swelling with tenderness to palpation.  No cervical adenopathy.  No evidence of Ludwig's angina.  Patient able to swallow.  No bleeding in the mouth, dental or oral lesions.    Independent ECG/labs interpretation:  Laboratory work-up not indicated at this time; appears to be a simple dental abscess without systemic complications  Disposition:   After consideration of the diagnostic results and the patients response to treatment, I feel that emergency department workup does not suggest an emergent condition requiring admission or immediate intervention beyond what has been performed at this time.  The patient is safe for discharge and has been instructed to return immediately for worsening symptoms, change in symptoms or any other concerns.  Will place patient on Augmentin prescription and refer to dental for further treatment and management of dental abscess.  Patient does not currently have established dental care.  Recommended patient continue to use ibuprofen for pain and ice for swelling.           Final Clinical Impression(s) / ED Diagnoses Final diagnoses:  Dental abscess    Rx / DC Orders ED Discharge Orders          Ordered    amoxicillin-clavulanate (AUGMENTIN) 875-125 MG tablet  Every 12 hours        12/14/21 0715              Melton Alar R, PA 12/14/21 0717    Melene Plan, DO 12/14/21 0732

## 2022-02-27 ENCOUNTER — Emergency Department (HOSPITAL_BASED_OUTPATIENT_CLINIC_OR_DEPARTMENT_OTHER): Payer: Medicaid Other

## 2022-02-27 ENCOUNTER — Emergency Department (HOSPITAL_BASED_OUTPATIENT_CLINIC_OR_DEPARTMENT_OTHER)
Admission: EM | Admit: 2022-02-27 | Discharge: 2022-02-27 | Disposition: A | Discharge status: Home / Self Care | Payer: Medicaid Other | Attending: Emergency Medicine | Admitting: Emergency Medicine

## 2022-02-27 ENCOUNTER — Encounter (HOSPITAL_BASED_OUTPATIENT_CLINIC_OR_DEPARTMENT_OTHER): Payer: Self-pay | Admitting: Emergency Medicine

## 2022-02-27 ENCOUNTER — Other Ambulatory Visit: Payer: Self-pay

## 2022-02-27 DIAGNOSIS — S6991XA Unspecified injury of right wrist, hand and finger(s), initial encounter: Secondary | ICD-10-CM | POA: Insufficient documentation

## 2022-02-27 DIAGNOSIS — Z79899 Other long term (current) drug therapy: Secondary | ICD-10-CM | POA: Insufficient documentation

## 2022-02-27 DIAGNOSIS — M25531 Pain in right wrist: Secondary | ICD-10-CM | POA: Diagnosis present

## 2022-02-27 DIAGNOSIS — S6992XA Unspecified injury of left wrist, hand and finger(s), initial encounter: Secondary | ICD-10-CM

## 2022-02-27 NOTE — ED Triage Notes (Signed)
Pt arrives to ED with c/o right wrist pain x2.5 weeks.

## 2022-02-27 NOTE — ED Notes (Signed)
Pt in bed, pt has splint in place, pt states that he is ready to go home, pt verbalized understanding d/c and follow up, pt from dpt.
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
## 2022-02-27 NOTE — Discharge Instructions (Signed)
Wear your splint at all times including sleeping for the next 4 to 6 weeks.  You may remove it for bathing. Apply ice to the wrist at least 5 times a day for 20 minutes at a time.  Please place a towel between your wrist and the ice bag to prevent burn injury from the eyes.  Get help right away if you have any worsening of symptoms.  Follow closely with Dr. Greta Doom for further evaluation of your wrist injury.  Your x-ray showed no abnormalities

## 2022-04-16 IMAGING — DX DG CHEST 1V PORT
1 series · 1 of 1 positions shown · non-contrast
Comparison: 12/16/2018

CLINICAL DATA: Shortness of breath.

EXAM:
PORTABLE CHEST 1 VIEW

[chest ap]
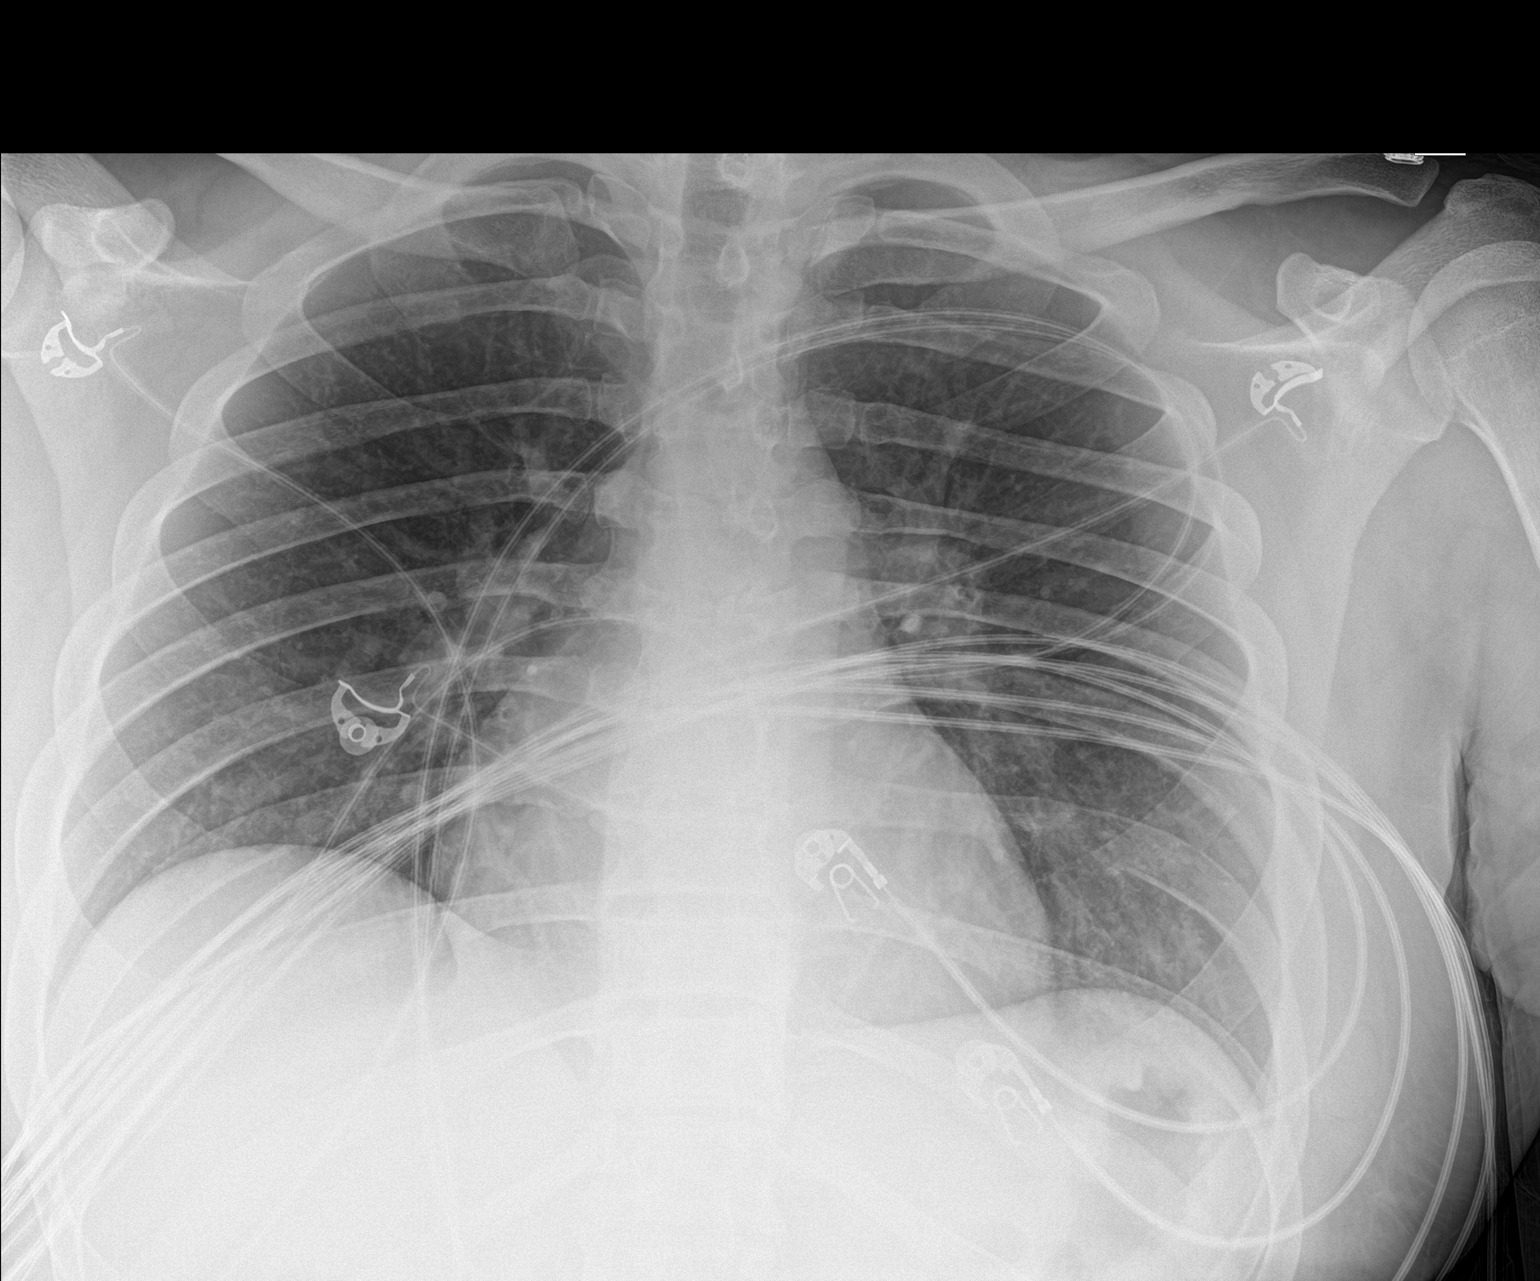

[1 of 1 positions shown; findings below may reference images not displayed]

FINDINGS: The cardiac silhouette, mediastinal and hilar contours are within
normal limits.

Low lung volumes with vascular crowding and streaky basilar
atelectasis. No definite infiltrates or effusions.

The bony thorax is intact.
IMPRESSION: Low lung volumes with vascular crowding and streaky basilar
atelectasis. No definite infiltrates or effusions.

## 2022-05-06 ENCOUNTER — Other Ambulatory Visit: Payer: Self-pay

## 2022-05-06 ENCOUNTER — Emergency Department (HOSPITAL_COMMUNITY)
Admission: EM | Admit: 2022-05-06 | Discharge: 2022-05-06 | Disposition: A | Discharge status: Home / Self Care | Payer: Medicaid Other | Attending: Emergency Medicine | Admitting: Emergency Medicine

## 2022-05-06 ENCOUNTER — Encounter (HOSPITAL_COMMUNITY): Payer: Self-pay

## 2022-05-06 DIAGNOSIS — Z1152 Encounter for screening for COVID-19: Secondary | ICD-10-CM | POA: Diagnosis not present

## 2022-05-06 DIAGNOSIS — T7840XA Allergy, unspecified, initial encounter: Secondary | ICD-10-CM | POA: Diagnosis not present

## 2022-05-06 DIAGNOSIS — J029 Acute pharyngitis, unspecified: Secondary | ICD-10-CM | POA: Diagnosis present

## 2022-05-06 LAB — RESP PANEL BY RT-PCR (RSV, FLU A&B, COVID)  RVPGX2
Influenza A by PCR: NEGATIVE
Influenza B by PCR: NEGATIVE
Resp Syncytial Virus by PCR: NEGATIVE
SARS Coronavirus 2 by RT PCR: NEGATIVE

## 2022-05-06 LAB — GROUP A STREP BY PCR: Group A Strep by PCR: NOT DETECTED

## 2022-05-06 NOTE — Discharge Instructions (Signed)
Take zyrtec or claritin for symptoms

## 2022-05-06 NOTE — ED Triage Notes (Signed)
C/o sore throat, runny nose, chills, and cough x2 days.

## 2022-05-06 NOTE — ED Provider Notes (Signed)
[place] [facility]MENT AT [facility] Provider Note   CSN: [id number] Arrival date & time: 05/06/22  1432     History  Chief Complaint  Patient presents with   Sore Throat    [name] [name] is a 22 y.o. male.  Patient complains of a sore throat since yesterday.  Patient reports he feels like he is having allergy symptoms.  Patient reports his nose is congested.  Patient reports pain with swallowing.  Patient has been taking an over-the-counter pain reliever.  Has not tried taking any medication for allergies patient denies any shortness of breath he denies any fever or chills  The history is provided by the patient. No language interpreter was used.  Sore Throat This is a new problem. The current episode started yesterday. The problem occurs constantly. The problem has not changed since onset.Pertinent negatives include no chest pain. Nothing aggravates the symptoms. He has tried nothing for the symptoms. The treatment provided no relief.       Home Medications Prior to Admission medications   Medication Sig Start Date End Date Taking? Authorizing Provider  acetaminophen (TYLENOL) 500 MG tablet Take 1,500 mg by mouth every 6 (six) hours as needed for moderate pain or headache.    [provider]  amoxicillin-clavulanate (AUGMENTIN) 875-125 MG tablet Take 1 tablet by mouth every 12 (twelve) hours. 12/14/21   Clark, Meghan R, PA-C  ARIPiprazole (ABILIFY) 10 MG tablet Take 1 tablet (10 mg total) by mouth at bedtime. 12/14/20   Ardis Hughs, NP  ibuprofen (ADVIL) 200 MG tablet Take 400-600 mg by mouth every 6 (six) hours as needed for headache.    [provider]      Allergies    Haloperidol and related and Trazodone and nefazodone    Review of Systems   Review of Systems  Cardiovascular:  Negative for chest pain.  All other systems reviewed and are negative.   Physical Exam Updated Vital Signs BP 123/76 (BP Location: Right Arm)    Pulse 71   Temp 98.3 F (36.8 C) (Oral)   Resp 15   Wt 104 kg   SpO2 100%   BMI 30.25 kg/m  Physical Exam Vitals and nursing note reviewed.  Constitutional:      Appearance: He is well-developed.  HENT:     Head: Normocephalic.     Mouth/Throat:     Mouth: Mucous membranes are moist.     Pharynx: Posterior oropharyngeal erythema present.  Eyes:     Conjunctiva/sclera: Conjunctivae normal.  Cardiovascular:     Rate and Rhythm: Normal rate.  Pulmonary:     Effort: Pulmonary effort is normal.  Abdominal:     General: There is no distension.  Musculoskeletal:        General: Normal range of motion.     Cervical back: Normal range of motion.  Skin:    General: Skin is warm.  Neurological:     General: No focal deficit present.     Mental Status: He is alert and oriented to person, place, and time.  Psychiatric:        Mood and Affect: Mood normal.     ED Results / Procedures / Treatments   Labs (all labs ordered are listed, but only abnormal results are displayed) Labs Reviewed  GROUP A STREP BY PCR  RESP PANEL BY RT-PCR (RSV, FLU A&B, COVID)  [id number]    EKG None  Radiology No results found.  Procedures Procedures  Medications Ordered in ED Medications - No data to display  ED Course/ Medical Decision Making/ A&P                             Medical Decision Making Patient complains of allergy symptoms and a sore throat.  Amount and/or Complexity of Data Reviewed Labs: ordered. Decision-making details documented in ED Course.    Details: Labs ordered reviewed and interpreted strep is negative COVID is negative  Risk Risk Details: Patient advised Tylenol for discomfort patient advised to try over-the-counter allergy medicine Zyrtec Allegra or Claritin as needed.           Final Clinical Impression(s) / ED Diagnoses Final diagnoses:  Pharyngitis, unspecified etiology  Allergy, initial encounter    Rx / DC Orders ED Discharge Orders      None      An After Visit Summary was printed and given to the patient.    Elson Areas, [place] 05/06/22 1844    Mardene Sayer, MD 05/06/22 2329

## 2022-05-25 ENCOUNTER — Ambulatory Visit (HOSPITAL_COMMUNITY)
Admission: EM | Admit: 2022-05-25 | Discharge: 2022-05-26 | Disposition: A | Discharge status: Home / Self Care | Payer: Medicaid Other | Attending: Behavioral Health | Admitting: Behavioral Health

## 2022-05-25 ENCOUNTER — Encounter (HOSPITAL_COMMUNITY): Payer: Self-pay | Admitting: Behavioral Health

## 2022-05-25 DIAGNOSIS — F4323 Adjustment disorder with mixed anxiety and depressed mood: Secondary | ICD-10-CM | POA: Insufficient documentation

## 2022-05-25 DIAGNOSIS — F3481 Disruptive mood dysregulation disorder: Secondary | ICD-10-CM | POA: Insufficient documentation

## 2022-05-25 DIAGNOSIS — F32A Depression, unspecified: Secondary | ICD-10-CM | POA: Diagnosis present

## 2022-05-25 DIAGNOSIS — F330 Major depressive disorder, recurrent, mild: Secondary | ICD-10-CM

## 2022-05-25 DIAGNOSIS — Z9151 Personal history of suicidal behavior: Secondary | ICD-10-CM | POA: Insufficient documentation

## 2022-05-25 DIAGNOSIS — F17210 Nicotine dependence, cigarettes, uncomplicated: Secondary | ICD-10-CM | POA: Insufficient documentation

## 2022-05-25 DIAGNOSIS — R45851 Suicidal ideations: Secondary | ICD-10-CM | POA: Diagnosis not present

## 2022-05-25 DIAGNOSIS — Z79899 Other long term (current) drug therapy: Secondary | ICD-10-CM | POA: Insufficient documentation

## 2022-05-25 DIAGNOSIS — F331 Major depressive disorder, recurrent, moderate: Secondary | ICD-10-CM

## 2022-05-25 DIAGNOSIS — F250 Schizoaffective disorder, bipolar type: Secondary | ICD-10-CM | POA: Diagnosis not present

## 2022-05-25 DIAGNOSIS — Z1152 Encounter for screening for COVID-19: Secondary | ICD-10-CM | POA: Insufficient documentation

## 2022-05-25 DIAGNOSIS — F1290 Cannabis use, unspecified, uncomplicated: Secondary | ICD-10-CM | POA: Insufficient documentation

## 2022-05-25 MED ORDER — ACETAMINOPHEN 325 MG PO TABS
650.0000 mg | ORAL_TABLET | Freq: Four times a day (QID) | ORAL | Status: DC | PRN
Start: 2022-05-25 — End: 2022-05-26
  Administered 2022-05-25 – 2022-05-26 (×2): 650 mg via ORAL
  Filled 2022-05-25 (×2): qty 2

## 2022-05-25 MED ORDER — ARIPIPRAZOLE 10 MG PO TABS
10.0000 mg | ORAL_TABLET | Freq: Every day | ORAL | Status: DC
  Administered 2022-05-25: 10 mg via ORAL
  Filled 2022-05-25: qty 1
  Filled 2022-05-25: qty 7

## 2022-05-25 MED ORDER — ZIPRASIDONE MESYLATE 20 MG IM SOLR: 20.0000 mg | INTRAMUSCULAR | Status: DC | PRN

## 2022-05-25 MED ORDER — ALUM & MAG HYDROXIDE-SIMETH 200-200-20 MG/5ML PO SUSP: 30.0000 mL | ORAL | Status: DC | PRN

## 2022-05-25 MED ORDER — LORAZEPAM 1 MG PO TABS
1.0000 mg | ORAL_TABLET | ORAL | Status: AC | PRN
  Administered 2022-05-25: 1 mg via ORAL
  Filled 2022-05-25: qty 1

## 2022-05-25 MED ORDER — HYDROXYZINE HCL 25 MG PO TABS: 25.0000 mg | ORAL_TABLET | Freq: Three times a day (TID) | ORAL | Status: DC | PRN

## 2022-05-25 MED ORDER — MAGNESIUM HYDROXIDE 400 MG/5ML PO SUSP: 30.0000 mL | Freq: Every day | ORAL | Status: DC | PRN

## 2022-05-25 MED ORDER — OLANZAPINE 5 MG PO TBDP
5.0000 mg | ORAL_TABLET | Freq: Three times a day (TID) | ORAL | Status: DC | PRN
  Administered 2022-05-25: 5 mg via ORAL
  Filled 2022-05-25: qty 1
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
## 2022-05-25 NOTE — ED Notes (Signed)
Pt sleeping@this time. Breathing even and unlabored. Will continue to monitor for safety 

## 2022-05-25 NOTE — ED Notes (Signed)
Pt has been asleep for about an hour. Has eaten dinner and watched a movie. Will continue to monitor for safety.

## 2022-05-25 NOTE — ED Notes (Signed)
Pt came to this staff person and stated he was sorry for being upset.  He apologized to staff.   He was given scrubs and shower supplies and is currently taking a shower.  Pt took prn medication without difficulty.

## 2022-05-25 NOTE — ED Notes (Signed)
Pt refused blood draw and urine spec at time of admission.  He did allow for his person to be searched and his shoes with strings and hoodie with string were locked up along with his wallet in locker 1.   Pt was brought onto the unit and given a sandwich.  He stated " I lied to them so I wouldn't miss work and now I am fired".  Pt asked to speak with provider again after being brought onto the unit.  Provider made aware.
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
## 2022-05-25 NOTE — ED Notes (Signed)
Pt is currently watching tv.  Reports that he is "feeling better".  Pt is complaining of pain in his lower left jaw area from a toothache. He was given tylenol as ordered.

## 2022-05-25 NOTE — ED Notes (Signed)
Pt is pacing back and forth in the obs area.  He has wrapped a pillow case around his hand. Pt offered to go outside and walk around. He also was offered the prn medication that is available.  Pt refused both these interventions at this time.

## 2022-05-26 MED ORDER — ARIPIPRAZOLE 10 MG PO TABS: 10.0000 mg | ORAL_TABLET | Freq: Every day | ORAL | 0 refills | Status: DC

## 2022-05-26 NOTE — ED Notes (Signed)
Patient pleasant to staff. Converses appropriately. Breakfast provided. Denies SI/HI/AVH. Safety maintained and will continue to monitor.

## 2022-05-26 NOTE — ED Notes (Signed)
Discharge instructions provided and Pt stated understanding. Pt alert, orient and ambulatory prior to d/c from facility. Personal belongings returned from locker number 1. Sample prescription provided along with instructions of use. Showed patient where to go for follow up appointments. Escorted patient to lobby to d/c with family. Safety maintained.

## 2022-05-26 NOTE — ED Notes (Signed)
Pt sleeping@this time. Breathing even and unlabored. Will continue to monitor for safety 

## 2022-05-26 NOTE — ED Provider Notes (Signed)
FBC/OBS ASAP Discharge Summary  Date and Time: 05/26/2022 9:17 AM  Name: [name] [name]  MRN:  [id number]   Discharge Diagnoses:  Final diagnoses:  Suicidal ideation  Moderate episode of recurrent major depressive disorder (HCC)   Subjective:   Pt reports presenting to [facility]  yesterday under IVC petition after speaking to his attorney earlier in the day. Pt states he told his attorney he had been having suicidal thoughts since Monday and on Wednesday has thoughts about overdosing on percocet. He denies he had access to percocet at the time. He reports thoughts were passing. He is requesting to be discharged today so he can go to work. He states he has a shift today at 12PM. He would like to follow up outpatient. He gives verbal consent to speak with his friend's mother [name] [name]. He states [name] [name] is support for him and he sees her nearly every day. He denies suicidal, homicidal or violent ideations. He denies auditory visual hallucinations or paranoia.  Collateral from [employer], [phone]. [name] [name] states she sees pt nearly every day. She denies pt is a danger to himself or others. She states pt does not have access to percocets or other means to harm himself. She states she and her family can help support pt. She states she can pick pt up from this facility today.  Appointments made for pt at [facility] outpatient clinic - medication management 05/31/22 10AM with Karel Jarvis, counseling 07/12/22 1PM with Stephan Minister. Yesterday pt was started on abilify 10mg  qhs. Will provide 7 day sample. Also discussed open access hours if pt would like to be seen on earlier date. Pt agrees to follow up outpatient. [name] [name] states she can support pt in attending appointments.  Stay Summary:  22 y/o male w/ history of Schizoaffective disorder, bipolar type, psychosis, schizophrenia, paranoid type, DMDD, MDD, adjustment disorder with mixed anxiety and depressed mood, ADHD, anger, aggressive behavior, suicidal  ideation, intentional overdose, bipolar I disorder, cannabis use disorder, and intellectual disability. Pt presents to [facility] on 05/25/22 under IVC petition and admitted to continuous assessment for suicidal ideation, depression, crisis management and stabilization. On reassessment, pt denies suicidal, homicidal or violent ideations. He denies auditory visual hallucinations or paranoia. Collateral denies safety concerns. Discharge to outpatient follow up.  Total Time spent with patient: 45 minutes  Past Psychiatric History: Schizoaffective disorder, bipolar type, psychosis, schizophrenia, paranoid type, DMDD, MDD, adjustment disorder with mixed anxiety and depressed mood, ADHD, anger, aggressive behavior, suicidal ideation, intentional overdose, bipolar I disorder, cannabis use disorder, and intellectual disability  Past Medical History: None reported Family History: None reported Family Psychiatric History: None reported Social History: Living in halfway house, has a roommate, employed at [employer] Tobacco Cessation:  A prescription for an FDA-approved tobacco cessation medication was offered at discharge and the patient refused  Current Medications:  Current Facility-Administered Medications  Medication Dose Route Frequency Provider Last Rate Last Admin   acetaminophen (TYLENOL) tablet 650 mg  650 mg Oral Q6H PRN Sunday Corn, NP   650 mg at 05/26/22 0634   alum & mag hydroxide-simeth (MAALOX/MYLANTA) 200-200-20 MG/5ML suspension 30 mL  30 mL Oral Q4H PRN Sunday Corn, NP       ARIPiprazole (ABILIFY) tablet 10 mg  10 mg Oral QHS Sunday Corn, NP   10 mg at 05/25/22 2154   hydrOXYzine (ATARAX) tablet 25 mg  25 mg Oral TID PRN Sunday Corn, NP       magnesium hydroxide (MILK OF MAGNESIA) suspension 30 mL  30 mL Oral Daily PRN Sunday Corn, NP       OLANZapine zydis (ZYPREXA) disintegrating tablet 5 mg  5 mg Oral Q8H PRN Sunday Corn, NP   5 mg at 05/25/22 1534   And    ziprasidone (GEODON) injection 20 mg  20 mg Intramuscular PRN Sunday Corn, NP       Current Outpatient Medications  Medication Sig Dispense Refill   ARIPiprazole (ABILIFY) 10 MG tablet Take 1 tablet (10 mg total) by mouth at bedtime. 14 tablet 0    PTA Medications:  Facility Ordered Medications  Medication   acetaminophen (TYLENOL) tablet 650 mg   alum & mag hydroxide-simeth (MAALOX/MYLANTA) 200-200-20 MG/5ML suspension 30 mL   magnesium hydroxide (MILK OF MAGNESIA) suspension 30 mL   hydrOXYzine (ATARAX) tablet 25 mg   ARIPiprazole (ABILIFY) tablet 10 mg   OLANZapine zydis (ZYPREXA) disintegrating tablet 5 mg   And   [COMPLETED] LORazepam (ATIVAN) tablet 1 mg   And   ziprasidone (GEODON) injection 20 mg   PTA Medications  Medication Sig   ARIPiprazole (ABILIFY) 10 MG tablet Take 1 tablet (10 mg total) by mouth at bedtime.       12/14/2020    5:39 AM 10/15/2019    5:20 AM 08/20/2019   10:32 AM  Depression screen PHQ 2/9  Decreased Interest 1 2 1   Down, Depressed, Hopeless 2 1 2   PHQ - 2 Score 3 3 3   Altered sleeping 1 1 3   Tired, decreased energy 1 2 0  Change in appetite 2 2 1   Feeling bad or failure about yourself  1 3 1   Trouble concentrating 2 3 1   Moving slowly or fidgety/restless 1 2 1   Suicidal thoughts 1 2 2   PHQ-9 Score 12 18 12   Difficult doing work/chores Very difficult Somewhat difficult Somewhat difficult    Flowsheet Row ED from 05/25/2022 in [facility] ED from 05/06/2022 in [facility] Emergency Department at [facility] ED from 02/27/2022 in [facility] Emergency Department at [facility]  C-SSRS RISK CATEGORY No Risk No Risk No Risk       Musculoskeletal  Strength & Muscle Tone: within normal limits Gait & Station: normal Patient leans: N/A  Psychiatric Specialty Exam  Presentation  General Appearance:  Appropriate for Environment; Other (comment) (dressed in scrubs)  Eye  Contact: Good  Speech: Clear and Coherent; Other (comment) (overall normal rate, stutter noted intermittently)  Speech Volume: Normal  Handedness: Right   Mood and Affect  Mood: Anxious; Depressed  Affect: Blunt   Thought Process  Thought Processes: Coherent; Goal Directed; Linear  Descriptions of Associations:Intact  Orientation:Full (Time, Place and Person)  Thought Content:Logical; WDL  Diagnosis of Schizophrenia or Schizoaffective disorder in past: Yes    Hallucinations:Hallucinations: None  Ideas of Reference:None  Suicidal Thoughts:Suicidal Thoughts: No  Homicidal Thoughts:Homicidal Thoughts: No   Sensorium  Memory: Immediate Good; Recent Good; Remote Good  Judgment: Fair  Insight: Fair   Art therapist  Concentration: Good  Attention Span: Good  Recall: Good  Fund of Knowledge: Good  Language: Good   Psychomotor Activity  Psychomotor Activity: Psychomotor Activity: Normal   Assets  Assets: Communication Skills; Desire for Improvement; Financial Resources/Insurance; Housing; Physical Health; Resilience; Social Support; Vocational/Educational   Sleep  Sleep: Sleep: -- (varies) Number of Hours of Sleep: 0 (varies)   Nutritional Assessment (For OBS and FBC admissions only) Has the patient had a weight loss or gain of 10 pounds  or more in the last 3 months?: No Has the patient had a decrease in food intake/or appetite?: No Does the patient have dental problems?: No Does the patient have eating habits or behaviors that may be indicators of an eating disorder including binging or inducing vomiting?: No Has the patient recently lost weight without trying?: 0 Has the patient been eating poorly because of a decreased appetite?: 0 Malnutrition Screening Tool Score: 0    Physical Exam  Physical Exam Constitutional:      General: He is not in acute distress.    Appearance: He is not ill-appearing, toxic-appearing or  diaphoretic.  Eyes:     General: No scleral icterus. Cardiovascular:     Rate and Rhythm: Normal rate.  Pulmonary:     Effort: Pulmonary effort is normal. No respiratory distress.  Skin:    General: Skin is warm and dry.  Neurological:     Mental Status: He is alert and oriented to person, place, and time.  Psychiatric:        Attention and Perception: Attention and perception normal.        Mood and Affect: Mood is anxious and depressed. Affect is blunt.        Speech: Speech normal.        Behavior: Behavior normal. Behavior is cooperative.        Thought Content: Thought content normal.        Cognition and Memory: Cognition and memory normal.        Judgment: Judgment normal.    Review of Systems  Constitutional:  Negative for chills and fever.  Respiratory:  Negative for shortness of breath.   Cardiovascular:  Negative for chest pain and palpitations.  Gastrointestinal:  Negative for abdominal pain.  Neurological:  Negative for headaches.  Psychiatric/Behavioral:  Positive for depression. The patient is nervous/anxious.    Blood pressure 124/86, pulse 88, temperature 98.6 F (37 C), temperature source Oral, resp. rate 19, SpO2 100 %. There is no height or weight on file to calculate BMI.  Demographic Factors:  Male and Adolescent or young adult  Loss Factors: Legal issues  Historical Factors: Prior suicide attempts  Risk Reduction Factors:   Employed and Positive social support  Continued Clinical Symptoms:  Previous Psychiatric Diagnoses and Treatments  Cognitive Features That Contribute To Risk:  None    Suicide Risk:  Mild:  Suicidal ideation of limited frequency, intensity, duration, and specificity.  There are no identifiable plans, no associated intent, mild dysphoria and related symptoms, good self-control (both objective and subjective assessment), few other risk factors, and identifiable protective factors, including available and accessible social  support.  Plan Of Care/Follow-up recommendations:  Follow up with outpatient behavioral health services Appointments made for pt at [facility] outpatient clinic - medication management 05/31/22 10AM with Karel Jarvis, counseling 07/12/22 1PM with Stephan Minister 7 day sample of abilify 10mg  qhs provided at discharge  Disposition:  Discharge  Lauree Chandler, NP 05/26/2022, 9:17 AM

## 2022-05-26 NOTE — Discharge Instructions (Addendum)
You have been scheduled for appointments for medication management and counseling at [facility] ([address], [place], [place], [place] [address]).  Medication management: 05/31/22 10:00AM with Karel Jarvis  Counseling: 07/12/22 1:00PM with Stephan Minister  You may be seen earlier during open access hours for medication management and counseling. Open access hours for medication management are Monday through Friday. Open access hours for counseling are Monday, Wednesday, Thursday. Patients are seen on a first come, first served basis and spots are limited. You may not be seen the same day you arrive. Please arrive early, such as by 7AM, to increase the likelihood of being seen the same day.

## 2022-05-31 ENCOUNTER — Ambulatory Visit (HOSPITAL_COMMUNITY): Payer: Medicaid Other | Admitting: Physician Assistant

## 2022-07-12 ENCOUNTER — Ambulatory Visit (HOSPITAL_COMMUNITY): Payer: Medicaid Other | Admitting: Mental Health

## 2023-01-08 ENCOUNTER — Emergency Department (HOSPITAL_COMMUNITY)
Admission: EM | Admit: 2023-01-08 | Discharge: 2023-01-09 | Disposition: A | Discharge status: Home / Self Care | Payer: 59 | Attending: Emergency Medicine | Admitting: Emergency Medicine

## 2023-01-08 ENCOUNTER — Emergency Department (HOSPITAL_COMMUNITY): Payer: 59

## 2023-01-08 ENCOUNTER — Other Ambulatory Visit: Payer: Self-pay

## 2023-01-08 DIAGNOSIS — F259 Schizoaffective disorder, unspecified: Secondary | ICD-10-CM | POA: Diagnosis present

## 2023-01-08 DIAGNOSIS — W228XXA Striking against or struck by other objects, initial encounter: Secondary | ICD-10-CM | POA: Diagnosis not present

## 2023-01-08 DIAGNOSIS — T6592XA Toxic effect of unspecified substance, intentional self-harm, initial encounter: Secondary | ICD-10-CM | POA: Insufficient documentation

## 2023-01-08 DIAGNOSIS — R4589 Other symptoms and signs involving emotional state: Secondary | ICD-10-CM | POA: Diagnosis present

## 2023-01-08 DIAGNOSIS — R45851 Suicidal ideations: Secondary | ICD-10-CM | POA: Insufficient documentation

## 2023-01-08 DIAGNOSIS — S61317A Laceration without foreign body of left little finger with damage to nail, initial encounter: Secondary | ICD-10-CM | POA: Insufficient documentation

## 2023-01-08 DIAGNOSIS — F79 Unspecified intellectual disabilities: Secondary | ICD-10-CM

## 2023-01-08 DIAGNOSIS — T454X4A Poisoning by iron and its compounds, undetermined, initial encounter: Secondary | ICD-10-CM | POA: Diagnosis not present

## 2023-01-08 DIAGNOSIS — T1491XA Suicide attempt, initial encounter: Secondary | ICD-10-CM | POA: Diagnosis not present

## 2023-01-08 DIAGNOSIS — R1013 Epigastric pain: Secondary | ICD-10-CM | POA: Diagnosis not present

## 2023-01-08 DIAGNOSIS — S6991XA Unspecified injury of right wrist, hand and finger(s), initial encounter: Secondary | ICD-10-CM | POA: Diagnosis not present

## 2023-01-08 LAB — COMPREHENSIVE METABOLIC PANEL
ALT: 29 U/L (ref 0–44)
ALT: 24 U/L (ref 0–44)
AST: 32 U/L (ref 15–41)
AST: 30 U/L (ref 15–41)
Albumin: 3.7 g/dL (ref 3.5–5.0)
Albumin: 3.1 g/dL — ABNORMAL LOW (ref 3.5–5.0)
Alkaline Phosphatase: 52 U/L (ref 38–126)
Alkaline Phosphatase: 41 U/L (ref 38–126)
Anion gap: 11 (ref 5–15)
Anion gap: 9 (ref 5–15)
BUN: 8 mg/dL (ref 6–20)
BUN: 7 mg/dL (ref 6–20)
CO2: 22 mmol/L (ref 22–32)
CO2: 21 mmol/L — ABNORMAL LOW (ref 22–32)
Calcium: 9.3 mg/dL (ref 8.9–10.3)
Calcium: 8.4 mg/dL — ABNORMAL LOW (ref 8.9–10.3)
Chloride: 106 mmol/L (ref 98–111)
Chloride: 107 mmol/L (ref 98–111)
Creatinine, Ser: 0.96 mg/dL (ref 0.61–1.24)
Creatinine, Ser: 1.06 mg/dL (ref 0.61–1.24)
GFR, Estimated: >60 mL/min (ref >60)
GFR, Estimated: >60 mL/min (ref >60)
Glucose, Bld: 85 mg/dL (ref 70–99)
Glucose, Bld: 102 mg/dL — ABNORMAL HIGH (ref 70–99)
Potassium: 3.5 mmol/L (ref 3.5–5.1)
Potassium: 3.7 mmol/L (ref 3.5–5.1)
Sodium: 139 mmol/L (ref 135–145)
Sodium: 137 mmol/L (ref 135–145)
Total Bilirubin: 0.7 mg/dL (ref <1.2)
Total Bilirubin: 0.8 mg/dL (ref <1.2)
Total Protein: 6.4 g/dL — ABNORMAL LOW (ref 6.5–8.1)
Total Protein: 5.2 g/dL — ABNORMAL LOW (ref 6.5–8.1)

## 2023-01-08 LAB — IRON AND TIBC
Iron: 369 ug/dL — ABNORMAL HIGH (ref 45–182)
Iron: 294 ug/dL — ABNORMAL HIGH (ref 45–182)
Saturation Ratios: 84 % — ABNORMAL HIGH (ref 17.9–39.5)
Saturation Ratios: 82 % — ABNORMAL HIGH (ref 17.9–39.5)
TIBC: 440 ug/dL (ref 250–450)
TIBC: 358 ug/dL (ref 250–450)
UIBC: 71 ug/dL
UIBC: 64 ug/dL

## 2023-01-08 LAB — TROPONIN I (HIGH SENSITIVITY)
Troponin I (High Sensitivity): 8 ng/L (ref <18)
Troponin I (High Sensitivity): 12 ng/L (ref <18)

## 2023-01-08 LAB — CBC
HCT: 46.9 % (ref 39.0–52.0)
Hemoglobin: 16 g/dL (ref 13.0–17.0)
MCH: 31.6 pg (ref 26.0–34.0)
MCHC: 34.1 g/dL (ref 30.0–36.0)
MCV: 92.5 fL (ref 80.0–100.0)
Platelets: 285 10*3/uL (ref 150–400)
RBC: 5.07 MIL/uL (ref 4.22–5.81)
RDW: 13 % (ref 11.5–15.5)
WBC: 9.6 10*3/uL (ref 4.0–10.5)
nRBC: 0 % (ref 0.0–0.2)

## 2023-01-08 LAB — SALICYLATE LEVEL: Salicylate Lvl: <7 mg/dL — ABNORMAL LOW (ref 7.0–30.0)

## 2023-01-08 LAB — CBG MONITORING, ED: Glucose-Capillary: 122 mg/dL — ABNORMAL HIGH (ref 70–99)

## 2023-01-08 LAB — CK
Total CK: 693 U/L — ABNORMAL HIGH (ref 49–397)
Total CK: 570 U/L — ABNORMAL HIGH (ref 49–397)

## 2023-01-08 LAB — LIPASE, BLOOD: Lipase: 46 U/L (ref 11–51)

## 2023-01-08 LAB — ACETAMINOPHEN LEVEL
Acetaminophen (Tylenol), Serum: <10 ug/mL — ABNORMAL LOW (ref 10–30)
Acetaminophen (Tylenol), Serum: <10 ug/mL — ABNORMAL LOW (ref 10–30)

## 2023-01-08 LAB — ETHANOL: Alcohol, Ethyl (B): <10 mg/dL (ref <10)

## 2023-01-08 MED ORDER — ARIPIPRAZOLE 10 MG PO TABS
10.0000 mg | ORAL_TABLET | Freq: Every day | ORAL | Status: DC
  Administered 2023-01-08 – 2023-01-09 (×2): 10 mg via ORAL
  Filled 2023-01-08 (×2): qty 1

## 2023-01-08 MED ORDER — LACTATED RINGERS IV SOLN: INTRAVENOUS | Status: AC

## 2023-01-08 MED ORDER — SERTRALINE HCL 25 MG PO TABS
25.0000 mg | ORAL_TABLET | Freq: Every day | ORAL | Status: DC
  Administered 2023-01-08 – 2023-01-09 (×2): 25 mg via ORAL
  Filled 2023-01-08 (×2): qty 1

## 2023-01-08 MED ORDER — LACTATED RINGERS IV BOLUS
1000.0000 mL | Freq: Once | INTRAVENOUS | Status: AC
  Administered 2023-01-08: 1000 mL via INTRAVENOUS

## 2023-01-08 MED ORDER — ONDANSETRON HCL 4 MG/2ML IJ SOLN
4.0000 mg | Freq: Once | INTRAMUSCULAR | Status: AC
  Administered 2023-01-08: 4 mg via INTRAVENOUS
  Filled 2023-01-08: qty 2

## 2023-01-08 NOTE — ED Notes (Signed)
Pt recently brought to [facility] from [facility], pending inpt versus reassessment. Pt quickly fell asleep after arrival.

## 2023-01-08 NOTE — ED Notes (Signed)
Patient ambulated to bathroom. He is alert, oriented and cooperative at this time.

## 2023-01-08 NOTE — ED Triage Notes (Signed)
Patient comes in via EMS for SI attempt after taking medications after being outside his ex girlfriends house. He said he took half a bottle of ibuprofen 200s, and a full bottle of iron pills. Hx of self harm, schizophrenia, bipolar. One episode of vomiting. Also claims he chased the pills with ETOH.   20G LAC 4 mg Zofran

## 2023-01-08 NOTE — ED Notes (Signed)
IVC paperwork complete and in orange zone, expires 01/15/23, case # [id number]

## 2023-01-08 NOTE — ED Notes (Signed)
Pt has 1 bag of belongings in locker 5 in lock up. No valuables with security. No home meds.

## 2023-01-08 NOTE — Progress Notes (Signed)
   01/08/23 1000  Spiritual Encounters  Type of Visit Initial  Care provided to: Patient  Orlene Erm partners present during encounter Other (comment)  Reason for visit Routine spiritual support  OnCall Visit No   Visited with patient briefly while making rounds.

## 2023-01-08 NOTE — ED Provider Notes (Signed)
[place] EMERGENCY DEPARTMENT AT [facility] Provider Note   CSN: [id number] Arrival date & time: 01/08/23  0102     History  Chief Complaint  Patient presents with   Suicide Attempt    [name] [name] is a 22 y.o. male.  Patient arrives via EMS after suicide attempt.  He was in an argument with his ex-girlfriend.  He took "half a bottle" of ibuprofen 200 mg tablets about 35 minutes ago.  He also took a "full bottle of iron pills".  He believes they are more than 20 but is not sure.  Does not know the milligrams.  Admits to trying to hurt himself.  Did have 2 episodes of vomiting for EMS and is now complaining of some chest pain and epigastric pain.  No blood in the emesis.  No black or bloody stools.  No shortness of breath.  Admits to ingesting some alcohol tonight as well.  No other drugs.  Did punch a tree and sustained a laceration to his right hand.  Denies hearing any voices or wanting to hurt anyone else.  The history is provided by the patient and the EMS personnel.       Home Medications Prior to Admission medications   Medication Sig Start Date End Date Taking? Authorizing Provider  ARIPiprazole (ABILIFY) 10 MG tablet Take 1 tablet (10 mg total) by mouth at bedtime. 05/26/22   Lauree Chandler, NP      Allergies    Haloperidol and related and Trazodone and nefazodone    Review of Systems   Review of Systems  Constitutional:  Negative for activity change, appetite change and fever.  HENT:  Negative for congestion and rhinorrhea.   Respiratory:  Negative for cough, chest tightness and shortness of breath.   Gastrointestinal:  Positive for nausea and vomiting.  Genitourinary:  Negative for dysuria.  Skin:  Positive for wound.  Psychiatric/Behavioral:  Positive for decreased concentration, self-injury, sleep disturbance and suicidal ideas. The patient is nervous/anxious and is hyperactive.     all other systems are negative except as noted in the HPI  and PMH.   Physical Exam Updated Vital Signs BP 131/82 (BP Location: Left Arm)   Pulse (!) 56   Temp (!) 97.5 F (36.4 C) (Temporal)   Resp 10   SpO2 100%  Physical Exam Vitals and nursing note reviewed.  Constitutional:      General: He is not in acute distress.    Appearance: He is well-developed.  HENT:     Head: Normocephalic and atraumatic.     Mouth/Throat:     Pharynx: No oropharyngeal exudate.  Eyes:     Conjunctiva/sclera: Conjunctivae normal.     Pupils: Pupils are equal, round, and reactive to light.  Neck:     Comments: No meningismus. Cardiovascular:     Rate and Rhythm: Normal rate and regular rhythm.     Heart sounds: Normal heart sounds. No murmur heard. Pulmonary:     Effort: Pulmonary effort is normal. No respiratory distress.     Breath sounds: Normal breath sounds.  Abdominal:     Palpations: Abdomen is soft.     Tenderness: There is abdominal tenderness. There is no guarding or rebound.     Comments: Epigastric tenderness  Musculoskeletal:        General: No tenderness. Normal range of motion.     Cervical back: Normal range of motion and neck supple.     Comments: Laceration dorsal fifth MCP with  out bony deformity.  Skin:    General: Skin is warm.  Neurological:     Mental Status: He is alert and oriented to person, place, and time.     Cranial Nerves: No cranial nerve deficit.     Motor: No abnormal muscle tone.     Coordination: Coordination normal.     Comments: No ataxia on finger to nose bilaterally. No pronator drift. 5/5 strength throughout. CN 2-12 intact.Equal grip strength. Sensation intact.   Psychiatric:        Behavior: Behavior normal.     ED Results / Procedures / Treatments   Labs (all labs ordered are listed, but only abnormal results are displayed) Labs Reviewed  COMPREHENSIVE METABOLIC PANEL - Abnormal; Notable for the following components:      Result Value   Total Protein 6.4 (*)    All other components within  normal limits  SALICYLATE LEVEL - Abnormal; Notable for the following components:   Salicylate Lvl <7.0 (*)    All other components within normal limits  ACETAMINOPHEN LEVEL - Abnormal; Notable for the following components:   Acetaminophen (Tylenol), Serum <10 (*)    All other components within normal limits  CK - Abnormal; Notable for the following components:   Total CK 693 (*)    All other components within normal limits  IRON AND TIBC - Abnormal; Notable for the following components:   Iron 369 (*)    Saturation Ratios 84 (*)    All other components within normal limits  COMPREHENSIVE METABOLIC PANEL - Abnormal; Notable for the following components:   CO2 21 (*)    Glucose, Bld 102 (*)    Calcium 8.4 (*)    Total Protein 5.2 (*)    Albumin 3.1 (*)    All other components within normal limits  CK - Abnormal; Notable for the following components:   Total CK 570 (*)    All other components within normal limits  ACETAMINOPHEN LEVEL - Abnormal; Notable for the following components:   Acetaminophen (Tylenol), Serum <10 (*)    All other components within normal limits  IRON AND TIBC - Abnormal; Notable for the following components:   Iron 294 (*)    Saturation Ratios 82 (*)    All other components within normal limits  CBG MONITORING, ED - Abnormal; Notable for the following components:   Glucose-Capillary 122 (*)    All other components within normal limits  ETHANOL  CBC  LIPASE, BLOOD  RAPID URINE DRUG SCREEN, HOSP PERFORMED  TROPONIN I (HIGH SENSITIVITY)  TROPONIN I (HIGH SENSITIVITY)    EKG EKG Interpretation Date/Time:  Monday January 08 2023 01:12:23 EST Ventricular Rate:  60 PR Interval:  173 QRS Duration:  93 QT Interval:  386 QTC Calculation: 386 R Axis:   95  Text Interpretation: Sinus rhythm Borderline right axis deviation No significant change was found Confirmed by Glynn Octave [phone]) on 01/08/2023 1:18:15 AM  Radiology DG Abd Portable 2  Views Result Date: 01/08/2023 CLINICAL DATA:  Iron ingestion. EXAM: PORTABLE ABDOMEN - 2 VIEW COMPARISON:  10/06/2013 FINDINGS: Scattered gas and stool throughout the colon. No small or large bowel distention. No free intra-abdominal air. No abnormal air-fluid levels. No radiopaque stones identified. No discrete radiopaque foreign bodies or ingested substances. Visualized bones and soft tissue contours appear intact. Lung bases are clear. IMPRESSION: Normal nonobstructive bowel gas pattern. Electronically Signed   By: Burman Nieves M.D.   On: 01/08/2023 01:50   DG Hand Complete  Right Result Date: 01/08/2023 CLINICAL DATA:  5th MCP injury EXAM: RIGHT HAND - COMPLETE 3+ VIEW COMPARISON:  None Available. FINDINGS: There is no evidence of fracture or dislocation. There is no evidence of arthropathy or other focal bone abnormality. Soft tissues are unremarkable. No radiopaque foreign body. IMPRESSION: Negative. Electronically Signed   By: Charlett Nose M.D.   On: 01/08/2023 01:49   DG Chest Portable 1 View Result Date: 01/08/2023 CLINICAL DATA:  Iron ingestion, suicide attempt EXAM: PORTABLE CHEST 1 VIEW COMPARISON:  02/04/2020 FINDINGS: The heart size and mediastinal contours are within normal limits. Both lungs are clear. The visualized skeletal structures are unremarkable. IMPRESSION: No active disease. Electronically Signed   By: Charlett Nose M.D.   On: 01/08/2023 01:48    Procedures .Critical Care  Performed by: Glynn Octave, MD Authorized by: Glynn Octave, MD   Critical care provider statement:    Critical care time (minutes):  60   Critical care time was exclusive of:  Separately billable procedures and treating other patients   Critical care was necessary to treat or prevent imminent or life-threatening deterioration of the following conditions:  Toxidrome   Critical care was time spent personally by me on the following activities:  Development of treatment plan with patient or  surrogate, discussions with consultants, evaluation of patient's response to treatment, examination of patient, ordering and review of laboratory studies, ordering and review of radiographic studies, ordering and performing treatments and interventions, pulse oximetry, re-evaluation of patient's condition, review of old charts, blood draw for specimens and obtaining history from patient or surrogate   I assumed direction of critical care for this patient from another provider in my specialty: no     Care discussed with: admitting provider       Medications Ordered in ED Medications  ondansetron (ZOFRAN) injection 4 mg (has no administration in time range)    ED Course/ Medical Decision Making/ A&P                                 Medical Decision Making Amount and/or Complexity of Data Reviewed Independent Historian: EMS Labs: ordered. Decision-making details documented in ED Course. Radiology: ordered and independent interpretation performed. Decision-making details documented in ED Course. ECG/medicine tests: ordered and independent interpretation performed. Decision-making details documented in ED Course.  Risk Prescription drug management.   Intentional ingestion of ibuprofen and iron.  Vital stable.  No distress. Abdomen soft without peritoneal signs.   IVC paperwork completed on arrival  Discussed with poison control.  Patient vomiting now.  Charcoal not recommended.  Advised abdominal x-ray, labs including c-Met and iron levels.  Will need repeat labs in 3 hours as well as a 4-hour Tylenol level. Call back poison control if iron level over 500 or if pills visible on xray.  X-ray negative for any radiopaque foreign bodies or pills in the stomach.  Right hand x-ray is negative.  Tetanus shot is up-to-date.  Initial iron level 369 which is below 500 treatment threshold.  Repeat iron level 3 hours later has improved to 294.  Metabolic panel is stable.  No  acidosis. Acetaminophen level remains undetectable.  CK is improving.  No acidosis.  CK is improving.  Acetaminophen level remains undetectable.  Iron level is improving.  Poison center to be contacted.  Discussed with poison center [facility].  Patient is medically clear from iron ingestion and should not require any further intervention.  Recommends monitoring for bradycardia and mental status changes until he is back to baseline.  No indication for charcoal or whole bowel irrigation.  Bradycardia has improved to the 60s.  Patient is awake and answering questions appropriately.  Denies symptoms.  He appears medically cleared for TTS evaluation.  Holding orders placed.       Final Clinical Impression(s) / ED Diagnoses Final diagnoses:  Suicide attempt (HCC)  Ingestion of substance, intentional self-harm, initial encounter [facility])    Rx / DC Orders ED Discharge Orders     None         Bartley Vuolo, Jeannett Senior, MD 01/08/23 [phone]
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
## 2023-01-08 NOTE — ED Notes (Signed)
Delivered IVC docs to [place]

## 2023-01-08 NOTE — ED Notes (Signed)
Handoff report given to Cletis [place], [place]

## 2023-01-09 ENCOUNTER — Encounter (HOSPITAL_COMMUNITY): Payer: Self-pay

## 2023-01-09 DIAGNOSIS — F259 Schizoaffective disorder, unspecified: Secondary | ICD-10-CM

## 2023-01-09 MED ORDER — SERTRALINE HCL 25 MG PO TABS: 25.0000 mg | ORAL_TABLET | Freq: Every day | ORAL | 0 refills | Status: DC

## 2023-01-09 MED ORDER — ARIPIPRAZOLE 10 MG PO TABS: 10.0000 mg | ORAL_TABLET | Freq: Every day | ORAL | 0 refills | Status: DC

## 2023-01-09 NOTE — ED Notes (Signed)
Rescintion completed and efiled envelope [id number] copy in medical records original copy in red folder

## 2023-01-09 NOTE — ED Notes (Signed)
Reviewed discharge instructions with patient. Follow-up care resources and medications reviewed. Patient verbalized understanding. Patient A&Ox4, VSS, and ambulatory with steady gait upon discharge. All belongings returned to pt. Bus pass provided.

## 2023-01-09 NOTE — Discharge Instructions (Signed)
Outpatient psychiatric Services  Walk in hours for medication management Monday, Wednesday, Thursday, and Friday from 8:00 AM to 11:00 AM Recommend arriving by by 7:30 AM.  It is first come first serve.    Walk in hours for therapy intake Monday and Wednesday only 8:00 AM to 11:00 AM Encouraged to arrive by 7:30 AM.  It is first come first serve   Inpatient patient psychiatric services The Facility Based Crisis [facility] offers comprehensive behavioral heath care services for mental health and substance abuse treatment.  Social work can also assist with referral to or getting you into a rehabilitation program short or long term  Set designer Resources:  Intensive Outpatient Programs: [employer]      [address] [place], [place] [phone] Both a day and evening program       [facility] Outpatient     [address]        [place], [place] [address] [phone]         ADS: Alcohol [facility] Drug Svcs [address] [place] [phone]  [facility] Mental Health ACCESS LINE: [phone] or [phone] [address] [place], [place] [address] [email or web address]   Substance Abuse Resources: Alcohol and Drug Services  [phone] [facility] [phone] The [place] [phone] Floydene Flock [phone] Residential & Outpatient Substance Abuse Program  [phone]  Psychological Services: [facility]  [phone] [facility]  [phone] [facility], [phone] [place]. [address] [place], ACCESS LINE: ([phone] or ([phone], [email or web address]  Mobile Crisis Teams:                                        Therapeutic Alternatives         Mobile Crisis Care Unit [phone]             [facility] [address] Ginette Otto [phone]                                          Interventionist [address] De[address] [place] [phone]  Self-Help/Support Groups: Mental Health Assoc. of [employer] of support groups [phone] (call for more info)  Narcotics Anonymous (NA) Caring Services [address] [place] - 2 meetings at this location  Residential Treatment Programs:  [facility]      [address]        [place]       [phone]         [facility] [address] [place] [address]86 [place], [place]  [address] [phone]  [facility]  [address] [place], [place] [address] [phone] Admissions: 8am-3pm M-F  [facility]     [address]        [place], [place] [address]       [phone]         [facility] [address] Starling Manns [place], [place] [phone]  [facility] [address] [place], [place] [phone]  [facility] - Intensive Outpatient      [address]     [place], [place]       [phone]  [facility] (Addiction Recovery Care [facility]oc.)     [address] [place], [place] [phone] or [phone]  Residential Treatment Services (RTS), Medicaid [address] [place], [place] [phone]  Fellowship [address]                                               [address] [place] [phone]  [facility] Resources: [phone]               General Therapy                                                Angie Fava, PhD        [address] [place], [place] [address]         [phone]   Insurance  [facility] Behavioral   [address] [place], [place] [address] [phone]  [facility] Recovery [address] [place], [place] [address] [phone] Insurance/Medicaid/sponsorship through [facility] and Families                                               [address]                                        [place], [place] [address]    Therapy/tele-psych/case         [phone]          [facility] [address] [place], [place]  [address]  Adolescent/group home/case management [phone]                                           Creola Corn PhD       General therapy       Insurance   [phone]         Dr. Lolly Mustache, [place], M-F [phone]  [facility]  [employer] [facility] Dept. [address]                 [address]         [address] [place] [address]                                                 Cristobal Goldmann Phone:  [phone]  Phone:  [phone]                   Phone:  [phone]  [facility], [phone] [facility] - [phone]       -     [facility] in [place], [address]             [phone], [employer] Health Resources:  Intensive Outpatient Programs: [facility]      [address] [place], [place] [phone] Both a day and evening program       [facility] Outpatient     [address]        [place], [place] [address] [phone]         ADS: Alcohol & Drug Svcs [address] [place] [phone]  [facility] Mental Health ACCESS LINE: ([phone] or [phone] [address] [place], [place] [address] [email or web address]   Substance Abuse Resources: [facility] Drug Services  [phone] [facility] [phone] The [place] [phone] Floydene Flock [phone] Residential & Outpatient Substance Abuse Program  [phone]  Psychological Services: [facility]  [phone] [facility]  [phone] [facility], [phone] [place]. [address] [place], ACCESS LINE: [phone] or ([phone])[phone], [email or web address]  Mobile Crisis Teams:                                        Therapeutic Alternatives         Mobile Crisis Care Unit [phone]             [facility] [address] Ginette Otto [phone]                                         Interventionist [address] De[address] [place] [phone]  Self-Help/Support Groups: Mental Health Assoc. of [employer] of support groups 631-540-2142 (call for more info)  Narcotics Anonymous (NA) Caring Services [address] [place] - 2 meetings at this location  Residential Treatment Programs:  [facility]      [address]        [place]       [phone]         [facility] [address], [place] [address]27 [place], [place]  [address] [phone]  [facility]  [address] [place], [place] [address] [phone] Admissions: 8am-3pm M-F  [facility]     [address]        [place], [place] [address]       [phone]         [facility] [address] Starling Manns [place], [place] [phone]  [facility] [address] [place], [place] [phone]  [facility] - Intensive Outpatient      [address]     [place], [place]       [phone]         [facility] (Addiction Recovery Care [facility]oc.)     [address] [place], [place] [phone] or [phone]  Residential Treatment Services (RTS), Medicaid [address] [place], [place] [phone]  [facility]ellowship Margo Aye  [address] [place] [phone]  [facility] Resources: [phone]               General Therapy                                                 Angie Fava, PhD        [address] [place], [place] [address]         [phone]   Insurance  [facility] Behavioral   [address] [place], [place] [address] [phone]  [facility] Recovery [address] [place], [place] [address] [phone] Insurance/Medicaid/sponsorship through [facility] and Families                                              [address]                                        [place], [place] [address]    Therapy/tele-psych/case         [phone]          [facility] [address], [place]  [address]  Adolescent/group home/case management [phone]                                           Creola Corn PhD       General therapy       Insurance   [phone]         Dr. Lolly Mustache, Insurance, M-F [phone]  [facility] [place]  [employer] [facility] Dept. [address]                 [address]         [address] [place] [address]  Blondell Reveal Phone:  [phone]                                  Phone:  [phone]                   Phone:  [phone]  [facility], [phone] [facility]- [phone]       -     [facility] in [place], [address],             [phone], Insurance

## 2023-01-09 NOTE — ED Provider Notes (Addendum)
Emergency Medicine Observation Re-evaluation Note  [name] [name] is a 22 y.o. male, seen on rounds today.  Pt initially presented to the ED for complaints of Suicide Attempt Currently, the patient is calm.  Physical Exam  BP 129/88 (BP Location: Left Arm)   Pulse 86   Temp (!) 97.4 F (36.3 C) (Oral)   Resp 18   SpO2 100%  Physical Exam General: resting   ED Course / MDM  EKG:EKG Interpretation Date/Time:  Monday January 08 2023 01:48:20 EST Ventricular Rate:  48 PR Interval:  194 QRS Duration:  93 QT Interval:  422 QTC Calculation: 377 R Axis:   83  Text Interpretation: Slow sinus arrhythmia ST elev, probable normal early repol pattern Confirmed by Alvester Chou [phone]) on 01/08/2023 7:36:55 AM  I have reviewed the labs performed to date as well as medications administered while in observation.  Recent changes in the last 24 hours include seen by psych, 24 hour re-eval.  11:13 AM patient cleared by psychiatry.  Discharge.  Plan  Current plan is for re-eval by psych in AM.   11:13 AM patient cleared by psychiatry.  Discharge.  Virgina Norfolk, DO 01/09/23 6045    Virgina Norfolk, DO 01/09/23 1113

## 2023-01-09 NOTE — ED Notes (Signed)
Coleman NP at bedside. 
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
## 2023-03-14 ENCOUNTER — Emergency Department (HOSPITAL_COMMUNITY)
Admission: EM | Admit: 2023-03-14 | Discharge: 2023-03-14 | Discharge status: Against Medical Advice | Payer: 59 | Attending: Emergency Medicine | Admitting: Emergency Medicine

## 2023-03-14 ENCOUNTER — Encounter (HOSPITAL_COMMUNITY): Payer: Self-pay | Admitting: Emergency Medicine

## 2023-03-14 ENCOUNTER — Other Ambulatory Visit: Payer: Self-pay

## 2023-03-14 DIAGNOSIS — Z5321 Procedure and treatment not carried out due to patient leaving prior to being seen by health care provider: Secondary | ICD-10-CM | POA: Insufficient documentation

## 2023-03-14 DIAGNOSIS — R1111 Vomiting without nausea: Secondary | ICD-10-CM | POA: Diagnosis not present

## 2023-03-14 DIAGNOSIS — R110 Nausea: Secondary | ICD-10-CM | POA: Diagnosis not present

## 2023-03-14 DIAGNOSIS — R112 Nausea with vomiting, unspecified: Secondary | ICD-10-CM | POA: Diagnosis not present

## 2023-03-14 DIAGNOSIS — R197 Diarrhea, unspecified: Secondary | ICD-10-CM | POA: Insufficient documentation

## 2023-03-14 DIAGNOSIS — R6889 Other general symptoms and signs: Secondary | ICD-10-CM | POA: Diagnosis not present

## 2023-03-14 LAB — CBC
HCT: 47.8 % (ref 39.0–52.0)
Hemoglobin: 16.6 g/dL (ref 13.0–17.0)
MCH: 32.2 pg (ref 26.0–34.0)
MCHC: 34.7 g/dL (ref 30.0–36.0)
MCV: 92.8 fL (ref 80.0–100.0)
Platelets: 255 10*3/uL (ref 150–400)
RBC: 5.15 MIL/uL (ref 4.22–5.81)
RDW: 12.2 % (ref 11.5–15.5)
WBC: 10.7 10*3/uL — ABNORMAL HIGH (ref 4.0–10.5)
nRBC: 0 % (ref 0.0–0.2)

## 2023-03-14 LAB — COMPREHENSIVE METABOLIC PANEL
ALT: 22 U/L (ref 0–44)
AST: 29 U/L (ref 15–41)
Albumin: 4.2 g/dL (ref 3.5–5.0)
Alkaline Phosphatase: 40 U/L (ref 38–126)
Anion gap: 12 (ref 5–15)
BUN: 14 mg/dL (ref 6–20)
CO2: 23 mmol/L (ref 22–32)
Calcium: 9.5 mg/dL (ref 8.9–10.3)
Chloride: 105 mmol/L (ref 98–111)
Creatinine, Ser: 0.91 mg/dL (ref 0.61–1.24)
GFR, Estimated: >60 mL/min (ref >60)
Glucose, Bld: 124 mg/dL — ABNORMAL HIGH (ref 70–99)
Potassium: 3.4 mmol/L — ABNORMAL LOW (ref 3.5–5.1)
Sodium: 140 mmol/L (ref 135–145)
Total Bilirubin: 1.4 mg/dL — ABNORMAL HIGH (ref 0.0–1.2)
Total Protein: 7.1 g/dL (ref 6.5–8.1)

## 2023-03-14 LAB — LIPASE, BLOOD: Lipase: 33 U/L (ref 11–51)

## 2023-03-14 MED ORDER — ONDANSETRON HCL 4 MG/2ML IJ SOLN
4.0000 mg | Freq: Once | INTRAMUSCULAR | Status: AC | PRN
  Administered 2023-03-14: 4 mg via INTRAVENOUS
  Filled 2023-03-14: qty 2

## 2023-03-14 NOTE — ED Triage Notes (Signed)
 Presents from home via EMS for emesis since 11pm. Pt refused to answer further questions to EMS. Endorses chills, diarrhea Denies known sick contacts EMS VS: 118/80, 110bpm, 98%RA No known daily medications

## 2023-03-14 NOTE — ED Provider Triage Note (Signed)
 Emergency Medicine Provider Triage Evaluation Note  [name] [name] , a 23 y.o. male  was evaluated in triage.  Pt complains of nausea, vomiting, and diarrhea that started tonight at 11pm.  Denies any pain.  Denies sick contacts.  Treated with zofran in triage.  Review of Systems  Positive: N/v/d Negative: pain  Physical Exam  BP 130/78 (BP Location: Right Arm)   Pulse 91   Temp 99.6 F (37.6 C) (Oral)   Resp 20   Wt 102.1 kg   SpO2 100%   BMI 29.69 kg/m  Gen:   Awake, no distress   Resp:  Normal effort  MSK:   Moves extremities without difficulty  Other:    Medical Decision Making  Medically screening exam initiated at 3:52 AM.  Appropriate orders placed.  [name] [name] was informed that the remainder of the evaluation will be completed by another provider, this initial triage assessment does not replace that evaluation, and the importance of remaining in the ED until their evaluation is complete.     Roxy Horseman, PA-C 03/14/23 [phone]

## 2023-03-14 NOTE — ED Notes (Signed)
 Specimen cup provided for urine, pt aware of need for sample

## 2023-07-26 ENCOUNTER — Emergency Department (HOSPITAL_COMMUNITY)
Admission: EM | Admit: 2023-07-26 | Discharge: 2023-07-26 | Disposition: A | Discharge status: Home / Self Care | Attending: Emergency Medicine | Admitting: Emergency Medicine

## 2023-07-26 ENCOUNTER — Encounter (HOSPITAL_COMMUNITY): Payer: Self-pay | Admitting: *Deleted

## 2023-07-26 ENCOUNTER — Other Ambulatory Visit: Payer: Self-pay

## 2023-07-26 DIAGNOSIS — Z765 Malingerer [conscious simulation]: Secondary | ICD-10-CM | POA: Diagnosis not present

## 2023-07-26 DIAGNOSIS — J45909 Unspecified asthma, uncomplicated: Secondary | ICD-10-CM | POA: Diagnosis not present

## 2023-07-26 DIAGNOSIS — R45851 Suicidal ideations: Secondary | ICD-10-CM | POA: Insufficient documentation

## 2023-07-26 DIAGNOSIS — R4689 Other symptoms and signs involving appearance and behavior: Secondary | ICD-10-CM | POA: Diagnosis not present

## 2023-07-26 LAB — COMPREHENSIVE METABOLIC PANEL WITH GFR
ALT: 23 U/L (ref 0–44)
AST: 25 U/L (ref 15–41)
Albumin: 4.6 g/dL (ref 3.5–5.0)
Alkaline Phosphatase: 44 U/L (ref 38–126)
Anion gap: 9 (ref 5–15)
BUN: 14 mg/dL (ref 6–20)
CO2: 30 mmol/L (ref 22–32)
Calcium: 10.3 mg/dL (ref 8.9–10.3)
Chloride: 103 mmol/L (ref 98–111)
Creatinine, Ser: 0.81 mg/dL (ref 0.61–1.24)
GFR, Estimated: >60 mL/min (ref >60)
Glucose, Bld: 92 mg/dL (ref 70–99)
Potassium: 4 mmol/L (ref 3.5–5.1)
Sodium: 142 mmol/L (ref 135–145)
Total Bilirubin: 1.1 mg/dL (ref 0.0–1.2)
Total Protein: 8.1 g/dL (ref 6.5–8.1)

## 2023-07-26 LAB — CBC
HCT: 48.6 % (ref 39.0–52.0)
Hemoglobin: 16.2 g/dL (ref 13.0–17.0)
MCH: 31.4 pg (ref 26.0–34.0)
MCHC: 33.3 g/dL (ref 30.0–36.0)
MCV: 94.2 fL (ref 80.0–100.0)
Platelets: 251 K/uL (ref 150–400)
RBC: 5.16 MIL/uL (ref 4.22–5.81)
RDW: 12.8 % (ref 11.5–15.5)
WBC: 9.7 K/uL (ref 4.0–10.5)
nRBC: 0 % (ref 0.0–0.2)

## 2023-07-26 LAB — RAPID URINE DRUG SCREEN, HOSP PERFORMED
Amphetamines: NOT DETECTED
Barbiturates: NOT DETECTED
Benzodiazepines: NOT DETECTED
Cocaine: NOT DETECTED
Opiates: NOT DETECTED
Tetrahydrocannabinol: POSITIVE — AB

## 2023-07-26 LAB — ETHANOL: Alcohol, Ethyl (B): <15 mg/dL (ref <15)

## 2023-07-26 LAB — SALICYLATE LEVEL: Salicylate Lvl: <7 mg/dL — ABNORMAL LOW (ref 7.0–30.0)

## 2023-07-26 LAB — ACETAMINOPHEN LEVEL: Acetaminophen (Tylenol), Serum: <10 ug/mL — ABNORMAL LOW (ref 10–30)
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
## 2023-07-26 NOTE — Discharge Instructions (Signed)
 Please strictly adhere to safety plan created today with your aunt for safe discharge listed below Please perform close outpatient follow-up with the your outpatient provider team for medication management and therapy at [facility] as discussed  Safety Plan [name] [name] will reach out to Jamse Hales (aunt), call 911 or call mobile crisis, or go to nearest emergency room if condition worsens or if suicidal thoughts become active Patients' will follow up with [facility] for outpatient psychiatric services (therapy/medication management).  The suicide prevention education provided includes the following: Suicide risk factors Suicide prevention and interventions National Suicide Hotline telephone number [facility] assessment telephone number [facility] Emergency Assistance 911 [facility] and/or Residential Mobile Crisis Unit telephone number Request made of family/significant other to:  Jamse Hales (aunt) Data processing manager (e.g., guns, rifles, knives), all items previously/currently identified as safety concern.   Remove drugs/medications (over the counter, prescriptions, illicit drugs), all items previously/currently identified as a safety concern.

## 2023-07-26 NOTE — ED Notes (Signed)
 Patient on the phone with his aunt who said he can come stay there [name] [name]

## 2023-07-26 NOTE — ED Provider Notes (Signed)
 [place] EMERGENCY DEPARTMENT AT [facility] Provider Note   CSN: [id number] Arrival date & time: 07/26/23  9773     Patient presents with: Psychiatric Evaluation   [name] [name] is a 23 y.o. male.  With past medical history of bipolar 1, learning disability, major depressive disorder, obstructive defiant disorder, suicidal behavior presents to emergency room with complaint of suicidal ideation.  He reports he has been feeling depressed.  When asked if he has a plan he reports no.  When asked how long he has been feeling suicidal he shrugs his shoulders.  Denies pain injury trauma or fall.  Notes he has not been taking his medication.  Denies drug or alcohol use.  Denies any hallucinations. Reports he is tired.    HPI     Prior to Admission medications   Medication Sig Start Date End Date Taking? Authorizing Provider  ARIPiprazole  (ABILIFY ) 10 MG tablet Take 1 tablet (10 mg total) by mouth daily. 01/10/23 02/09/23  Mardy Legacy, NP  sertraline  (ZOLOFT ) 25 MG tablet Take 1 tablet (25 mg total) by mouth daily. 01/10/23 02/09/23  Mardy Legacy, NP    Allergies: Haloperidol  and related and Trazodone  and nefazodone    Review of Systems  Psychiatric/Behavioral:  Positive for suicidal ideas.     Updated Vital Signs BP 112/68 (BP Location: Right Arm)   Pulse 65   Temp 98 F (36.7 C)   Resp 16   Ht 6' 1 (1.854 m)   Wt 102.1 kg   SpO2 100%   BMI 29.70 kg/m   Physical Exam Vitals and nursing note reviewed.  Constitutional:      General: He is not in acute distress.    Appearance: He is not toxic-appearing.  HENT:     Head: Normocephalic and atraumatic.  Eyes:     General: No scleral icterus.    Conjunctiva/sclera: Conjunctivae normal.  Cardiovascular:     Rate and Rhythm: Normal rate and regular rhythm.     Pulses: Normal pulses.     Heart sounds: Normal heart sounds.  Pulmonary:     Effort: Pulmonary effort is normal. No respiratory distress.      Breath sounds: Normal breath sounds.  Abdominal:     General: Abdomen is flat. Bowel sounds are normal.     Palpations: Abdomen is soft.     Tenderness: There is no abdominal tenderness.  Musculoskeletal:     Right lower leg: No edema.     Left lower leg: No edema.  Skin:    General: Skin is warm and dry.     Findings: No lesion.  Neurological:     General: No focal deficit present.     Mental Status: He is alert and oriented to person, place, and time. Mental status is at baseline.  Psychiatric:        Mood and Affect: Mood is depressed.        Behavior: Behavior is withdrawn.     (all labs ordered are listed, but only abnormal results are displayed) Labs Reviewed  RAPID URINE DRUG SCREEN, HOSP PERFORMED - Abnormal; Notable for the following components:      Result Value   Tetrahydrocannabinol POSITIVE (*)    All other components within normal limits  COMPREHENSIVE METABOLIC PANEL WITH GFR  ETHANOL  CBC  SALICYLATE LEVEL  ACETAMINOPHEN  LEVEL    EKG: None  Radiology: No results found.   Procedures   Medications Ordered in the ED - No data to display  Medical Decision Making Amount and/or Complexity of Data Reviewed Labs: ordered.   This patient presents to the ED for concern of SI, this involves an extensive number of treatment options, and is a complaint that carries with it a high risk of complications and morbidity.  The differential diagnosis includes medication side effect, suicidal thoughts, depression   Co morbidities that complicate the patient evaluation  Depression, bipolar 1, ODD    Lab Tests:  I personally interpreted labs.  The pertinent results include:   No leukocytosis.  No anemia.  CMP without electrolyte abnormality.  Rapid drug screen positive for THC EtOH negative    Cardiac Monitoring: / EKG:  The patient was maintained on a cardiac monitor.     Problem List / ED Course / Critical  interventions / Medication management  Patient presents emergency room with complaint of suicidal ideation and depression.  He notes he has had similar thoughts in the past. Denies drug or EtOH use.  He will not tell me how long he has been feeling suicidal nor if he has a plan.  He denies any pain.  Lungs clear to auscultation bilaterally.  No focal abdominal tenderness. No Lower extremities edema. Vitals stable. Reassuring labs at this time.  Diet has been ordered.  Psych consult has been ordered.  Will verify medications.  Patient is medically cleared to be evaluated by behavioral health for suicidal ideation.      Final diagnoses:  Suicidal ideation    ED Discharge Orders     None          Shermon Warren LOISE DEVONNA 07/26/23 0531    Griselda Norris, MD 07/26/23 [phone]

## 2023-07-26 NOTE — ED Provider Notes (Signed)
 Emergency Medicine Observation Re-evaluation Note  [name] [name] is a 23 y.o. male, seen on rounds today.  Pt initially presented to the ED for complaints of Psychiatric Evaluation Currently, the patient is resting comfortably in bed.  Physical Exam  BP 112/68 (BP Location: Right Arm)   Pulse 65   Temp 98 F (36.7 C)   Resp 16   Ht 6' 1 (1.854 m)   Wt 102.1 kg   SpO2 100%   BMI 29.70 kg/m  Physical Exam General: no distress Cardiac: normal perfusion Lungs: no retractions Psych: SI   Plan  Current plan is for pending psych plan     Corinthia No, DO 07/26/23 9176

## 2023-07-26 NOTE — ED Triage Notes (Signed)
 The pt reports that he is suicidal and depressed

## 2023-07-26 NOTE — ED Notes (Signed)
 Attempted to speak with patient but patient unwilling to cooperative in assessment and pulled blanket over his head.

## 2023-08-12 ENCOUNTER — Emergency Department (HOSPITAL_COMMUNITY)

## 2023-08-12 ENCOUNTER — Other Ambulatory Visit: Payer: Self-pay

## 2023-08-12 ENCOUNTER — Encounter (HOSPITAL_COMMUNITY): Payer: Self-pay

## 2023-08-12 ENCOUNTER — Emergency Department (HOSPITAL_COMMUNITY)
Admission: EM | Admit: 2023-08-12 | Discharge: 2023-08-13 | Attending: Emergency Medicine | Admitting: Emergency Medicine

## 2023-08-12 DIAGNOSIS — Z658 Other specified problems related to psychosocial circumstances: Secondary | ICD-10-CM | POA: Insufficient documentation

## 2023-08-12 DIAGNOSIS — F70 Mild intellectual disabilities: Secondary | ICD-10-CM | POA: Insufficient documentation

## 2023-08-12 DIAGNOSIS — F332 Major depressive disorder, recurrent severe without psychotic features: Secondary | ICD-10-CM | POA: Insufficient documentation

## 2023-08-12 DIAGNOSIS — Z9151 Personal history of suicidal behavior: Secondary | ICD-10-CM | POA: Diagnosis not present

## 2023-08-12 DIAGNOSIS — F29 Unspecified psychosis not due to a substance or known physiological condition: Secondary | ICD-10-CM

## 2023-08-12 DIAGNOSIS — J45909 Unspecified asthma, uncomplicated: Secondary | ICD-10-CM | POA: Diagnosis not present

## 2023-08-12 DIAGNOSIS — Z9152 Personal history of nonsuicidal self-harm: Secondary | ICD-10-CM | POA: Diagnosis not present

## 2023-08-12 DIAGNOSIS — R4589 Other symptoms and signs involving emotional state: Secondary | ICD-10-CM | POA: Diagnosis present

## 2023-08-12 DIAGNOSIS — F32A Depression, unspecified: Secondary | ICD-10-CM

## 2023-08-12 DIAGNOSIS — M542 Cervicalgia: Secondary | ICD-10-CM | POA: Diagnosis not present

## 2023-08-12 DIAGNOSIS — F330 Major depressive disorder, recurrent, mild: Secondary | ICD-10-CM | POA: Diagnosis present

## 2023-08-12 DIAGNOSIS — F79 Unspecified intellectual disabilities: Secondary | ICD-10-CM | POA: Diagnosis not present

## 2023-08-12 DIAGNOSIS — T1491XA Suicide attempt, initial encounter: Secondary | ICD-10-CM

## 2023-08-12 DIAGNOSIS — F319 Bipolar disorder, unspecified: Secondary | ICD-10-CM | POA: Diagnosis not present

## 2023-08-12 LAB — RAPID URINE DRUG SCREEN, HOSP PERFORMED
Amphetamines: NOT DETECTED
Barbiturates: NOT DETECTED
Benzodiazepines: NOT DETECTED
Cocaine: NOT DETECTED
Opiates: NOT DETECTED
Tetrahydrocannabinol: POSITIVE — AB

## 2023-08-12 LAB — BASIC METABOLIC PANEL WITH GFR
Anion gap: 12 (ref 5–15)
BUN: 13 mg/dL (ref 6–20)
CO2: 23 mmol/L (ref 22–32)
Calcium: 9.8 mg/dL (ref 8.9–10.3)
Chloride: 103 mmol/L (ref 98–111)
Creatinine, Ser: 0.76 mg/dL (ref 0.61–1.24)
GFR, Estimated: >60 mL/min (ref >60)
Glucose, Bld: 86 mg/dL (ref 70–99)
Potassium: 3.5 mmol/L (ref 3.5–5.1)
Sodium: 138 mmol/L (ref 135–145)

## 2023-08-12 LAB — CBC WITH DIFFERENTIAL/PLATELET
Abs Immature Granulocytes: 0.04 K/uL (ref 0.00–0.07)
Basophils Absolute: 0.1 K/uL (ref 0.0–0.1)
Basophils Relative: 1 %
Eosinophils Absolute: 0.1 K/uL (ref 0.0–0.5)
Eosinophils Relative: 1 %
HCT: 46.6 % (ref 39.0–52.0)
Hemoglobin: 15.8 g/dL (ref 13.0–17.0)
Immature Granulocytes: 1 %
Lymphocytes Relative: 27 %
Lymphs Abs: 2.3 K/uL (ref 0.7–4.0)
MCH: 30.6 pg (ref 26.0–34.0)
MCHC: 33.9 g/dL (ref 30.0–36.0)
MCV: 90.1 fL (ref 80.0–100.0)
Monocytes Absolute: 1 K/uL (ref 0.1–1.0)
Monocytes Relative: 12 %
Neutro Abs: 4.9 K/uL (ref 1.7–7.7)
Neutrophils Relative %: 58 %
Platelets: 277 K/uL (ref 150–400)
RBC: 5.17 MIL/uL (ref 4.22–5.81)
RDW: 12.2 % (ref 11.5–15.5)
WBC: 8.3 K/uL (ref 4.0–10.5)
nRBC: 0 % (ref 0.0–0.2)

## 2023-08-12 LAB — ETHANOL: Alcohol, Ethyl (B): <15 mg/dL (ref <15)

## 2023-08-12 MED ORDER — BENZTROPINE MESYLATE 0.5 MG PO TABS: 1.0000 mg | ORAL_TABLET | Freq: Two times a day (BID) | ORAL | Status: DC | PRN

## 2023-08-12 MED ORDER — HYDROXYZINE HCL 25 MG PO TABS
50.0000 mg | ORAL_TABLET | Freq: Three times a day (TID) | ORAL | Status: DC | PRN
  Administered 2023-08-13: 50 mg via ORAL
  Filled 2023-08-12: qty 2

## 2023-08-12 MED ORDER — OLANZAPINE 10 MG PO TBDP: 10.0000 mg | ORAL_TABLET | Freq: Three times a day (TID) | ORAL | Status: DC | PRN

## 2023-08-12 MED ORDER — OLANZAPINE 10 MG IM SOLR: 10.0000 mg | Freq: Once | INTRAMUSCULAR | Status: DC | PRN

## 2023-08-12 MED ORDER — ARIPIPRAZOLE 5 MG PO TABS
10.0000 mg | ORAL_TABLET | Freq: Every day | ORAL | Status: DC
  Administered 2023-08-13: 10 mg via ORAL
  Filled 2023-08-12: qty 2

## 2023-08-12 NOTE — BH Assessment (Signed)
 Patient was deferred to [facility] for a telepsych assessment. The assigned care coordinator will provide updates regarding the scheduling of the assessment. [facility] coordinator can be reached at [phone] for further information on the timing of the telepsych evaluation.

## 2023-08-12 NOTE — ED Triage Notes (Signed)
 Pt to ED from [place] in custody following a suicide attempt. Pt states he took strings and wrapped them around his neck, no redness or ligature marks noted. Pt Arrives A+O, NADN. Airway is clear and patent.

## 2023-08-12 NOTE — ED Notes (Signed)
TTS Consult complete 
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
## 2023-08-12 NOTE — ED Provider Notes (Signed)
 [place] EMERGENCY DEPARTMENT AT [facility] Provider Note   CSN: [id number] Arrival date & time: 08/12/23  1953     Patient presents with: Suicide Attempt   [name] [name] is a 23 y.o. male.   Patient is a 23 year old male with a history of depression, ADHD, asthma, bipolar disease who [facility] and being brought in today after a suicide attempt.  He reports he wrapped a string around his neck because he has been depressed and suicidal.  He denies taking any medications at this time.  He has also been cutting his arm.  The history is provided by the patient and medical records.       Prior to Admission medications   Medication Sig Start Date End Date Taking? Authorizing Provider  ARIPiprazole  (ABILIFY ) 10 MG tablet Take 1 tablet (10 mg total) by mouth daily. 01/10/23 02/09/23  Mardy Legacy, NP  sertraline  (ZOLOFT ) 25 MG tablet Take 1 tablet (25 mg total) by mouth daily. 01/10/23 02/09/23  Mardy Legacy, NP    Allergies: Haloperidol  and related and Trazodone  and nefazodone    Review of Systems  Updated Vital Signs BP 122/81 (BP Location: Left Arm)   Pulse 66   Temp 98.2 F (36.8 C) (Oral)   Resp 16   Ht 6' 1 (1.854 m)   Wt 93.7 kg   SpO2 99%   BMI 27.25 kg/m   Physical Exam Vitals and nursing note reviewed.  Constitutional:      General: He is not in acute distress.    Appearance: He is well-developed.  HENT:     Head: Normocephalic and atraumatic.  Eyes:     Conjunctiva/sclera: Conjunctivae normal.     Pupils: Pupils are equal, round, and reactive to light.  Neck:     Comments: Slight scratch over the anterior neck.  No swelling noted.  No voice changes. Cardiovascular:     Rate and Rhythm: Normal rate and regular rhythm.     Heart sounds: No murmur heard. Pulmonary:     Effort: Pulmonary effort is normal. No respiratory distress.     Breath sounds: Normal breath sounds. No wheezing or rales.   Abdominal:     General: There is no distension.     Palpations: Abdomen is soft.     Tenderness: There is no abdominal tenderness. There is no guarding or rebound.  Musculoskeletal:        General: No tenderness. Normal range of motion.     Cervical back: Normal range of motion and neck supple.     Right lower leg: No edema.     Left lower leg: No edema.  Skin:    General: Skin is warm and dry.     Findings: No erythema or rash.  Neurological:     Mental Status: He is alert and oriented to person, place, and time. Mental status is at baseline.  Psychiatric:        Mood and Affect: Mood is depressed. Affect is flat.        Behavior: Behavior normal.        Thought Content: Thought content includes suicidal ideation. Thought content includes suicidal plan.     (all labs ordered are listed, but only abnormal results are displayed) Labs Reviewed  CBC WITH DIFFERENTIAL/PLATELET  BASIC METABOLIC PANEL WITH GFR  ETHANOL  RAPID URINE DRUG SCREEN, HOSP PERFORMED    EKG: None  Radiology: DG Neck Soft Tissue Result Date: 08/12/2023 CLINICAL DATA:  Neck pain. EXAM: NECK SOFT TISSUES - 1+ VIEW COMPARISON:  None Available. FINDINGS: There is no evidence of retropharyngeal soft tissue swelling or epiglottic enlargement. The cervical airway is unremarkable and no radio-opaque foreign body identified. IMPRESSION: Negative. Electronically Signed   By: Greig Pique M.D.   On: 08/12/2023 20:29     Procedures   Medications Ordered in the ED - No data to display                                  Medical Decision Making Amount and/or Complexity of Data Reviewed Labs: ordered. Decision-making details documented in ED Course. Radiology: ordered and independent interpretation performed. Decision-making details documented in ED Course.   Pt with multiple medical problems and comorbidities and presenting today with a complaint that caries a high risk for morbidity and mortality.  Here today  with suicide attempt.  He reports he wanted to hang himself and he wrapped a string around his neck.  He has also been cutting his arm with multiple scratches on his left arm.  No deep wounds present.  He has no voice changes, no difficulty swallowing, tolerating his secretions and well-appearing. I independently interpreted and evaluated patient's labs.  CBC, BMP and EtOH are all within normal limits.  I have independently visualized and interpreted pt's images today. Soft tissue neck is negative.  Patient is medically cleared for psychiatric evaluation.     Final diagnoses:  Suicide attempt [facility])  Depression, unspecified depression type    ED Discharge Orders     None          Doretha Folks, MD 08/12/23 2152

## 2023-08-13 DIAGNOSIS — Z9152 Personal history of nonsuicidal self-harm: Secondary | ICD-10-CM | POA: Diagnosis not present

## 2023-08-13 DIAGNOSIS — F319 Bipolar disorder, unspecified: Secondary | ICD-10-CM

## 2023-08-13 DIAGNOSIS — F79 Unspecified intellectual disabilities: Secondary | ICD-10-CM

## 2023-08-13 NOTE — Discharge Instructions (Addendum)
 Discharge recommendations:  Patient is to take medications as prescribed. Please see information for follow-up appointment with psychiatry and therapy. Please follow up with your primary care provider for all medical related needs.   Therapy: We recommend that patient participate in individual therapy to address mental health concerns.  Medications: The patient or guardian is to contact a medical professional and/or outpatient provider to address any new side effects that develop. The patient or guardian should update outpatient providers of any new medications and/or medication changes.   Atypical antipsychotics: If you are prescribed an atypical antipsychotic, it is recommended that your height, weight, BMI, blood pressure, fasting lipid panel, and fasting blood sugar be monitored by your outpatient providers.  Safety:  The patient should abstain from use of illicit substances/drugs and abuse of any medications. If symptoms worsen or do not continue to improve or if the patient becomes actively suicidal or homicidal then it is recommended that the patient return to the closest hospital emergency department, the [facility], or call 911 for further evaluation and treatment. National Suicide Prevention Lifeline 1-800-SUICIDE or (780)797-9066.  About 988 988 offers 24/7 access to trained crisis counselors who can help people experiencing mental health-related distress. People can call or text 988 or chat 988lifeline.org for themselves or if they are worried about a loved one who may need crisis support.  Crisis Mobile: [facility]:                     929-403-9210 (for crisis response 24 hours a day) [facility] Hotline:                                            914-217-6525   Follow up with the mental health team in jail   1. Recommend initiation of mood stabilizer and antidepressant for management of bipolar depression and suicidality.  Suggested regimen:  Depakote  ER 500 mg qHS, monitor valproic acid  level, CBC, LFTs  Zoloft  25 mg daily, titrate as tolerated (with mood stabilizer on board)  Consider low-dose antipsychotic (e.g., risperidone  1 mg qHS) if hallucinations re-emerge or poor sleep noted.  2. Psychiatric follow-up at jail and routine suicide risk assessments  Referral to jail mental health services for therapy and case management  Notify jail medical and mental health unit of current mental status and medication recommendations  Recommend patient be referred for transfer to [facility] (state psychiatric facility) for stabilization, and due to chronic SI.   Safety Plan [name] [name] will reach out to his jail staff Rosina, Officer [employer], call 911 or call mobile crisis, or go to nearest emergency room if condition worsens or if suicidal thoughts become active Patients' will follow up with jail mental health for outpatient psychiatric services (therapy/medication management).  The suicide prevention education provided includes the following: Suicide risk factors Suicide prevention and interventions National Suicide Hotline telephone number [facility] assessment telephone number [facility] Emergency Assistance 911 [facility] and/or Residential Mobile Crisis Unit telephone number Request made of family/significant other to:  jail staff Rosina, Officer [employer] (e.g., guns, rifles, knives), all items previously/currently identified as safety concern.   Remove drugs/medications (over the counter, prescriptions, illicit drugs), all items previously/currently identified as a safety concern.

## 2023-08-13 NOTE — ED Notes (Signed)
 From Jadeka, Psych NP I am psych clearing this patient back to jail..putting in note..spoke with Rosina at the jail.[facility] are trying to transfer him [facility]  Pt D/c by this RN and is back in custody of Public relations account executive
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
## 2023-08-13 NOTE — ED Notes (Signed)
 Provided meal tray to PT

## 2023-08-13 NOTE — ED Notes (Signed)
 This RN spoke with Rosina with correctional facility,  per Rosina pt must be discharged back to facility and they will seek psychiatric care via Judge's order,  EDP and Cathaleen (Psych MD) notified.  Jadeka to assess pt face to face.  Contact # for Rosina [phone]

## 2023-08-13 NOTE — ED Provider Notes (Signed)
 Patient was evaluated by psychiatry NP Motley Mangrum this am in follow up.   Pt requires further psychiatric treatment however he is currently incarcerated.  Pt needs to be admitted to a psychiatric facility within the jail system.  Plan is for pt to discharged back to jail where they will arrange for transfer to [facility].   Randol Simmonds, MD 08/13/23 [phone]
